# Patient Record
Sex: Male | Born: 1935 | Race: White | Hispanic: No | Marital: Married | Smoking: Former smoker
Health system: Southern US, Community
[De-identification: ages and names within clinical notes are randomized; demographics above are authoritative.]

## PROBLEM LIST (undated history)

## (undated) DIAGNOSIS — C61 Malignant neoplasm of prostate: Secondary | ICD-10-CM

## (undated) DIAGNOSIS — Z86718 Personal history of other venous thrombosis and embolism: Secondary | ICD-10-CM

## (undated) DIAGNOSIS — Z923 Personal history of irradiation: Secondary | ICD-10-CM

## (undated) DIAGNOSIS — M1990 Unspecified osteoarthritis, unspecified site: Secondary | ICD-10-CM

## (undated) DIAGNOSIS — L309 Dermatitis, unspecified: Secondary | ICD-10-CM

## (undated) DIAGNOSIS — N138 Other obstructive and reflux uropathy: Secondary | ICD-10-CM

## (undated) DIAGNOSIS — N329 Bladder disorder, unspecified: Secondary | ICD-10-CM

## (undated) DIAGNOSIS — N401 Enlarged prostate with lower urinary tract symptoms: Secondary | ICD-10-CM

## (undated) DIAGNOSIS — G4733 Obstructive sleep apnea (adult) (pediatric): Secondary | ICD-10-CM

## (undated) DIAGNOSIS — Z973 Presence of spectacles and contact lenses: Secondary | ICD-10-CM

## (undated) DIAGNOSIS — R413 Other amnesia: Secondary | ICD-10-CM

## (undated) DIAGNOSIS — I6930 Unspecified sequelae of cerebral infarction: Secondary | ICD-10-CM

## (undated) DIAGNOSIS — I480 Paroxysmal atrial fibrillation: Secondary | ICD-10-CM

---

## 2001-10-27 ENCOUNTER — Ambulatory Visit: Admission: RE | Admit: 2001-10-27 | Discharge: 2001-11-08 | Payer: Self-pay | Admitting: Radiation Oncology

## 2003-08-09 ENCOUNTER — Encounter: Admission: RE | Admit: 2003-08-09 | Discharge: 2003-08-09 | Payer: Self-pay | Admitting: Nephrology

## 2003-08-16 ENCOUNTER — Encounter: Admission: RE | Admit: 2003-08-16 | Discharge: 2003-08-16 | Payer: Self-pay | Admitting: Nephrology

## 2003-09-08 ENCOUNTER — Ambulatory Visit: Admission: RE | Admit: 2003-09-08 | Discharge: 2003-10-18 | Payer: Self-pay | Admitting: Radiation Oncology

## 2003-10-24 ENCOUNTER — Ambulatory Visit: Admission: RE | Admit: 2003-10-24 | Discharge: 2004-01-22 | Payer: Self-pay | Admitting: Radiation Oncology

## 2004-05-14 ENCOUNTER — Ambulatory Visit (HOSPITAL_COMMUNITY): Admission: RE | Admit: 2004-05-14 | Discharge: 2004-05-14 | Payer: Self-pay | Admitting: Nephrology

## 2007-11-23 ENCOUNTER — Ambulatory Visit (HOSPITAL_COMMUNITY): Admission: RE | Admit: 2007-11-23 | Discharge: 2007-11-23 | Payer: Self-pay | Admitting: Urology

## 2009-06-29 ENCOUNTER — Encounter: Admission: RE | Admit: 2009-06-29 | Discharge: 2009-08-27 | Payer: Self-pay

## 2010-03-13 ENCOUNTER — Ambulatory Visit
Admission: RE | Admit: 2010-03-13 | Discharge: 2010-03-13 | Disposition: A | Discharge status: Home / Self Care | Payer: BC Managed Care – PPO | Source: Ambulatory Visit | Attending: Internal Medicine | Admitting: Internal Medicine

## 2010-03-13 ENCOUNTER — Other Ambulatory Visit: Payer: Self-pay | Admitting: Internal Medicine

## 2010-03-13 DIAGNOSIS — R52 Pain, unspecified: Secondary | ICD-10-CM

## 2011-02-04 HISTORY — PX: LUMBAR DISC SURGERY: SHX700

## 2011-07-15 ENCOUNTER — Ambulatory Visit
Admission: RE | Admit: 2011-07-15 | Discharge: 2011-07-15 | Disposition: A | Discharge status: Home / Self Care | Payer: BC Managed Care – PPO | Source: Ambulatory Visit | Attending: Nurse Practitioner | Admitting: Nurse Practitioner

## 2011-07-15 ENCOUNTER — Other Ambulatory Visit: Payer: Self-pay | Admitting: Nurse Practitioner

## 2011-07-15 DIAGNOSIS — M549 Dorsalgia, unspecified: Secondary | ICD-10-CM

## 2011-07-22 ENCOUNTER — Other Ambulatory Visit: Payer: Self-pay | Admitting: Nurse Practitioner

## 2011-07-22 DIAGNOSIS — M549 Dorsalgia, unspecified: Secondary | ICD-10-CM

## 2011-07-24 ENCOUNTER — Ambulatory Visit
Admission: RE | Admit: 2011-07-24 | Discharge: 2011-07-24 | Disposition: A | Discharge status: Home / Self Care | Payer: Medicare Other | Source: Ambulatory Visit | Attending: Nurse Practitioner | Admitting: Nurse Practitioner

## 2011-07-24 DIAGNOSIS — M549 Dorsalgia, unspecified: Secondary | ICD-10-CM

## 2012-01-06 ENCOUNTER — Ambulatory Visit: Payer: Medicare Other | Attending: Surgery | Admitting: Physical Therapy

## 2012-01-06 DIAGNOSIS — R5381 Other malaise: Secondary | ICD-10-CM | POA: Insufficient documentation

## 2012-01-06 DIAGNOSIS — M545 Low back pain, unspecified: Secondary | ICD-10-CM | POA: Insufficient documentation

## 2012-01-06 DIAGNOSIS — IMO0001 Reserved for inherently not codable concepts without codable children: Secondary | ICD-10-CM | POA: Insufficient documentation

## 2012-01-06 DIAGNOSIS — R262 Difficulty in walking, not elsewhere classified: Secondary | ICD-10-CM | POA: Insufficient documentation

## 2012-01-06 DIAGNOSIS — R293 Abnormal posture: Secondary | ICD-10-CM | POA: Insufficient documentation

## 2012-01-07 ENCOUNTER — Ambulatory Visit: Payer: Medicare Other | Admitting: Rehabilitation

## 2012-01-14 ENCOUNTER — Ambulatory Visit: Payer: Medicare Other | Admitting: Physical Therapy

## 2012-01-16 ENCOUNTER — Ambulatory Visit: Payer: Medicare Other | Admitting: Rehabilitation

## 2012-01-19 ENCOUNTER — Ambulatory Visit: Payer: Medicare Other | Admitting: Physical Therapy

## 2012-01-22 ENCOUNTER — Ambulatory Visit: Payer: Medicare Other | Admitting: Physical Therapy

## 2012-01-26 ENCOUNTER — Ambulatory Visit: Payer: Medicare Other | Admitting: Rehabilitation

## 2012-01-29 ENCOUNTER — Ambulatory Visit: Payer: Medicare Other | Admitting: Physical Therapy

## 2012-02-09 ENCOUNTER — Ambulatory Visit: Payer: Medicare Other | Attending: Surgery | Admitting: Rehabilitation

## 2012-02-09 DIAGNOSIS — M545 Low back pain, unspecified: Secondary | ICD-10-CM | POA: Insufficient documentation

## 2012-02-09 DIAGNOSIS — R5381 Other malaise: Secondary | ICD-10-CM | POA: Insufficient documentation

## 2012-02-09 DIAGNOSIS — R262 Difficulty in walking, not elsewhere classified: Secondary | ICD-10-CM | POA: Insufficient documentation

## 2012-02-09 DIAGNOSIS — IMO0001 Reserved for inherently not codable concepts without codable children: Secondary | ICD-10-CM | POA: Insufficient documentation

## 2012-02-09 DIAGNOSIS — R293 Abnormal posture: Secondary | ICD-10-CM | POA: Insufficient documentation

## 2012-02-12 ENCOUNTER — Ambulatory Visit: Payer: Medicare Other | Admitting: Rehabilitation

## 2012-02-16 ENCOUNTER — Ambulatory Visit: Payer: Medicare Other | Admitting: Rehabilitation

## 2012-02-19 ENCOUNTER — Ambulatory Visit: Payer: Medicare Other | Admitting: Rehabilitation

## 2012-02-23 ENCOUNTER — Ambulatory Visit: Payer: Medicare Other | Admitting: Rehabilitation

## 2012-02-26 ENCOUNTER — Ambulatory Visit: Payer: Medicare Other | Admitting: Physical Therapy

## 2012-03-01 ENCOUNTER — Ambulatory Visit: Payer: Medicare Other | Admitting: Physical Therapy

## 2012-03-04 ENCOUNTER — Ambulatory Visit: Payer: Medicare Other | Admitting: Physical Therapy

## 2012-03-08 ENCOUNTER — Ambulatory Visit: Payer: Medicare Other | Attending: Surgery | Admitting: Rehabilitation

## 2012-03-08 DIAGNOSIS — M545 Low back pain, unspecified: Secondary | ICD-10-CM | POA: Insufficient documentation

## 2012-03-08 DIAGNOSIS — R262 Difficulty in walking, not elsewhere classified: Secondary | ICD-10-CM | POA: Insufficient documentation

## 2012-03-08 DIAGNOSIS — IMO0001 Reserved for inherently not codable concepts without codable children: Secondary | ICD-10-CM | POA: Insufficient documentation

## 2012-03-08 DIAGNOSIS — R5381 Other malaise: Secondary | ICD-10-CM | POA: Insufficient documentation

## 2012-03-08 DIAGNOSIS — R293 Abnormal posture: Secondary | ICD-10-CM | POA: Insufficient documentation

## 2012-03-10 ENCOUNTER — Ambulatory Visit: Payer: Medicare Other | Admitting: Rehabilitation

## 2012-03-15 ENCOUNTER — Ambulatory Visit: Payer: Medicare Other | Admitting: Physical Therapy

## 2012-03-18 ENCOUNTER — Encounter: Payer: Medicare Other | Admitting: Rehabilitation

## 2012-03-29 ENCOUNTER — Ambulatory Visit: Payer: Medicare Other | Admitting: Rehabilitation

## 2012-04-08 ENCOUNTER — Other Ambulatory Visit: Payer: Self-pay | Admitting: Urology

## 2012-04-08 DIAGNOSIS — C61 Malignant neoplasm of prostate: Secondary | ICD-10-CM

## 2012-05-24 ENCOUNTER — Encounter (HOSPITAL_COMMUNITY)
Admission: RE | Admit: 2012-05-24 | Discharge: 2012-05-24 | Disposition: A | Discharge status: Home / Self Care | Payer: Medicare Other | Source: Ambulatory Visit | Attending: Urology | Admitting: Urology

## 2012-05-24 DIAGNOSIS — C61 Malignant neoplasm of prostate: Secondary | ICD-10-CM

## 2012-05-24 MED ORDER — TECHNETIUM TC 99M MEDRONATE IV KIT
25.0000 | PACK | Freq: Once | INTRAVENOUS | Status: AC | PRN
  Administered 2012-05-24: 27 via INTRAVENOUS

## 2012-06-04 HISTORY — PX: TRANSTHORACIC ECHOCARDIOGRAM: SHX275

## 2012-06-11 ENCOUNTER — Other Ambulatory Visit (HOSPITAL_COMMUNITY): Payer: Self-pay | Admitting: Internal Medicine

## 2012-06-11 DIAGNOSIS — R609 Edema, unspecified: Secondary | ICD-10-CM

## 2012-06-11 DIAGNOSIS — I4891 Unspecified atrial fibrillation: Secondary | ICD-10-CM

## 2012-06-22 ENCOUNTER — Ambulatory Visit (HOSPITAL_COMMUNITY)
Admission: RE | Admit: 2012-06-22 | Discharge: 2012-06-22 | Disposition: A | Discharge status: Home / Self Care | Payer: Medicare Other | Source: Ambulatory Visit | Attending: Internal Medicine | Admitting: Internal Medicine

## 2012-06-22 DIAGNOSIS — I4891 Unspecified atrial fibrillation: Secondary | ICD-10-CM

## 2012-06-22 DIAGNOSIS — R609 Edema, unspecified: Secondary | ICD-10-CM | POA: Insufficient documentation

## 2012-06-22 NOTE — Progress Notes (Signed)
[facility]   2D echo completed 06/22/2012.   Cindy Anguel Delapena, RDCS  

## 2012-12-22 ENCOUNTER — Other Ambulatory Visit: Payer: Self-pay | Admitting: Urology

## 2012-12-22 DIAGNOSIS — C61 Malignant neoplasm of prostate: Secondary | ICD-10-CM

## 2013-01-06 ENCOUNTER — Encounter (HOSPITAL_COMMUNITY)
Admission: RE | Admit: 2013-01-06 | Discharge: 2013-01-06 | Disposition: A | Discharge status: Home / Self Care | Payer: Medicare Other | Source: Ambulatory Visit | Attending: Urology | Admitting: Urology

## 2013-01-06 DIAGNOSIS — C61 Malignant neoplasm of prostate: Secondary | ICD-10-CM | POA: Insufficient documentation

## 2013-01-06 MED ORDER — FLUDEOXYGLUCOSE F - 18 (FDG) INJECTION
10.6000 | Freq: Once | INTRAVENOUS | Status: AC | PRN
  Administered 2013-01-06: 10.6 via INTRAVENOUS

## 2014-01-18 ENCOUNTER — Other Ambulatory Visit: Payer: Self-pay | Admitting: Urology

## 2014-01-18 DIAGNOSIS — C61 Malignant neoplasm of prostate: Secondary | ICD-10-CM

## 2014-02-08 ENCOUNTER — Encounter (HOSPITAL_COMMUNITY): Payer: Medicare Other

## 2014-05-22 ENCOUNTER — Encounter (HOSPITAL_COMMUNITY): Payer: Medicare Other

## 2014-05-22 ENCOUNTER — Other Ambulatory Visit: Payer: Self-pay | Admitting: Family Medicine

## 2014-05-22 DIAGNOSIS — R609 Edema, unspecified: Secondary | ICD-10-CM

## 2014-05-23 ENCOUNTER — Ambulatory Visit
Admission: RE | Admit: 2014-05-23 | Discharge: 2014-05-23 | Disposition: A | Discharge status: Home / Self Care | Payer: Medicare Other | Source: Ambulatory Visit | Attending: Family Medicine | Admitting: Family Medicine

## 2014-05-23 DIAGNOSIS — R609 Edema, unspecified: Secondary | ICD-10-CM

## 2014-05-29 ENCOUNTER — Encounter (HOSPITAL_COMMUNITY)
Admission: RE | Admit: 2014-05-29 | Discharge: 2014-05-29 | Disposition: A | Discharge status: Home / Self Care | Payer: Medicare Other | Source: Ambulatory Visit | Attending: Urology | Admitting: Urology

## 2014-05-29 DIAGNOSIS — C61 Malignant neoplasm of prostate: Secondary | ICD-10-CM | POA: Diagnosis not present

## 2014-05-29 MED ORDER — TECHNETIUM TC 99M MEDRONATE IV KIT
24.7000 | PACK | Freq: Once | INTRAVENOUS | Status: AC | PRN
  Administered 2014-05-29: 24.7 via INTRAVENOUS

## 2014-11-07 ENCOUNTER — Other Ambulatory Visit: Payer: Self-pay | Admitting: Gastroenterology

## 2014-11-07 DIAGNOSIS — R152 Fecal urgency: Secondary | ICD-10-CM

## 2014-11-13 ENCOUNTER — Other Ambulatory Visit: Payer: Self-pay | Admitting: Gastroenterology

## 2014-11-13 ENCOUNTER — Ambulatory Visit
Admission: RE | Admit: 2014-11-13 | Discharge: 2014-11-13 | Disposition: A | Discharge status: Home / Self Care | Payer: Medicare Other | Source: Ambulatory Visit | Attending: Gastroenterology | Admitting: Gastroenterology

## 2014-11-13 DIAGNOSIS — R152 Fecal urgency: Secondary | ICD-10-CM

## 2015-02-15 ENCOUNTER — Encounter (HOSPITAL_BASED_OUTPATIENT_CLINIC_OR_DEPARTMENT_OTHER): Payer: Self-pay | Admitting: *Deleted

## 2015-02-15 ENCOUNTER — Other Ambulatory Visit: Payer: Self-pay | Admitting: Urology

## 2015-02-16 ENCOUNTER — Encounter (HOSPITAL_BASED_OUTPATIENT_CLINIC_OR_DEPARTMENT_OTHER): Payer: Self-pay | Admitting: *Deleted

## 2015-02-16 NOTE — Progress Notes (Signed)
SPOKE W/ PT AND PT'S WIFE,  PT HAS IMPAIRED MEMORY, WIFE WILL NEED TO BE IN PRE-OP.  NPO AFTER MN.  ARRIVE AT 0745.  GETTING LAB WORK DONE WED. 02-21-2015 (CBC, BMET, PT/INR).  CURRENT EKG AND LOV NOTE FROM DR Lawerance Cruel, PT'S PCP.  WILL TAKE AM MEDS W/ SIPS OF WATER DOS.

## 2015-02-21 DIAGNOSIS — Z8673 Personal history of transient ischemic attack (TIA), and cerebral infarction without residual deficits: Secondary | ICD-10-CM | POA: Diagnosis not present

## 2015-02-21 DIAGNOSIS — Z6837 Body mass index (BMI) 37.0-37.9, adult: Secondary | ICD-10-CM | POA: Diagnosis not present

## 2015-02-21 DIAGNOSIS — Z8546 Personal history of malignant neoplasm of prostate: Secondary | ICD-10-CM | POA: Diagnosis not present

## 2015-02-21 DIAGNOSIS — G4733 Obstructive sleep apnea (adult) (pediatric): Secondary | ICD-10-CM | POA: Diagnosis not present

## 2015-02-21 DIAGNOSIS — M1990 Unspecified osteoarthritis, unspecified site: Secondary | ICD-10-CM | POA: Diagnosis not present

## 2015-02-21 DIAGNOSIS — N401 Enlarged prostate with lower urinary tract symptoms: Secondary | ICD-10-CM | POA: Diagnosis not present

## 2015-02-21 DIAGNOSIS — N3040 Irradiation cystitis without hematuria: Secondary | ICD-10-CM | POA: Diagnosis not present

## 2015-02-21 DIAGNOSIS — N3941 Urge incontinence: Secondary | ICD-10-CM | POA: Diagnosis not present

## 2015-02-21 DIAGNOSIS — R3914 Feeling of incomplete bladder emptying: Secondary | ICD-10-CM | POA: Diagnosis not present

## 2015-02-21 DIAGNOSIS — C672 Malignant neoplasm of lateral wall of bladder: Secondary | ICD-10-CM | POA: Diagnosis not present

## 2015-02-21 DIAGNOSIS — Z7901 Long term (current) use of anticoagulants: Secondary | ICD-10-CM | POA: Diagnosis not present

## 2015-02-21 DIAGNOSIS — N138 Other obstructive and reflux uropathy: Secondary | ICD-10-CM | POA: Diagnosis not present

## 2015-02-21 DIAGNOSIS — Z923 Personal history of irradiation: Secondary | ICD-10-CM | POA: Diagnosis not present

## 2015-02-21 DIAGNOSIS — R319 Hematuria, unspecified: Secondary | ICD-10-CM | POA: Diagnosis present

## 2015-02-21 DIAGNOSIS — E7800 Pure hypercholesterolemia, unspecified: Secondary | ICD-10-CM | POA: Diagnosis not present

## 2015-02-21 DIAGNOSIS — I4891 Unspecified atrial fibrillation: Secondary | ICD-10-CM | POA: Diagnosis not present

## 2015-02-21 DIAGNOSIS — Z79899 Other long term (current) drug therapy: Secondary | ICD-10-CM | POA: Diagnosis not present

## 2015-02-21 DIAGNOSIS — Z87891 Personal history of nicotine dependence: Secondary | ICD-10-CM | POA: Diagnosis not present

## 2015-02-21 DIAGNOSIS — I10 Essential (primary) hypertension: Secondary | ICD-10-CM | POA: Diagnosis not present

## 2015-02-21 LAB — BASIC METABOLIC PANEL
ANION GAP: 10 (ref 5–15)
BUN: 19 mg/dL (ref 6–20)
CO2: 27 mmol/L (ref 22–32)
Calcium: 9.5 mg/dL (ref 8.9–10.3)
Chloride: 107 mmol/L (ref 101–111)
Creatinine, Ser: 1.03 mg/dL (ref 0.61–1.24)
GFR calc Af Amer: >60 mL/min (ref >60)
GLUCOSE: 109 mg/dL — AB (ref 65–99)
POTASSIUM: 3.8 mmol/L (ref 3.5–5.1)
Sodium: 144 mmol/L (ref 135–145)

## 2015-02-21 LAB — CBC
HEMATOCRIT: 44.1 % (ref 39.0–52.0)
HEMOGLOBIN: 14.3 g/dL (ref 13.0–17.0)
MCH: 28.1 pg (ref 26.0–34.0)
MCHC: 32.4 g/dL (ref 30.0–36.0)
MCV: 86.8 fL (ref 78.0–100.0)
Platelets: 229 10*3/uL (ref 150–400)
RBC: 5.08 MIL/uL (ref 4.22–5.81)
RDW: 15.8 % — ABNORMAL HIGH (ref 11.5–15.5)
WBC: 10 10*3/uL (ref 4.0–10.5)

## 2015-02-21 LAB — PROTIME-INR
INR: 1.15 (ref 0.00–1.49)
Prothrombin Time: 14.9 seconds (ref 11.6–15.2)

## 2015-02-21 NOTE — H&P (Signed)
Active Problems Problems  1. Edema (R60.9) 2. Erectile dysfunction due to arterial insufficiency (N52.01) 3. Gross hematuria (R31.0) 4. Hypertension (I10) 5. 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 Joint pain, hip (M25.559) 8. Microscopic hematuria (R31.29) 9. Nodular prostate with lower urinary tract symptoms (N40.3) 10. Prostate cancer (C61) 11. Urge incontinence of urine (N39.41)  History of Present Illness [name] returns today in f/u from his CT for cystoscopy to complete his hematuria w/u.  He had a possible non-obstructing 62m LUVJ stone on CT but no other significant findings. He has had no further gross hematuria but has persistent microhematuria today.   Past Medical History Problems  1. History of Carbuncle, groin (L02.234) 2. Gross hematuria (R31.0) 3. History of bronchitis (Z87.09) 4. History of hypercholesterolemia (Z86.39) 5. History of tinea cruris (Z86.19) 6. History of Incomplete bladder emptying (R33.9) 7. History of Obstructive Sleep Apnea 8. Personal history of prostate cancer (Z85.46) 9. History of Stroke syndrome (I63.9) 10. History of Venous stasis of lower extremity (I87.8)  Surgical History Problems  1. History of Back Surgery 2. History of Inguinal Hernia Repair 3. History of Radiation Therapy 4. History of Tonsillectomy  Current Meds 1. AmLODIPine Besylate 5 MG Oral Tablet;  Therapy: [id number] Recorded 2. Atorvastatin Calcium 20 MG Oral Tablet;  Therapy: 15Apr2013 to Recorded 3. Warfarin Sodium 2 MG Oral Tablet; 6229mon Sunday, Tuesday, Thursday and 29m32mn  Monday, Wednesday, Friday and Saturday;  Therapy: [phone] Recorded  Allergies Medication  1. Novocain SOLN 2. Penicillins 3. Sulfa Drugs  Family History Problems  1. Family history of Family Health Status Number Of Children   2 daughters 2. No pertinent family history : Sibling  Social History Problems  1. Denied:  Alcohol Use (History) 2. Family history of Death In The Family Father   80 04-Apr-1980amily history of Death In The Family Mother   85 76 4ormer smoker ([phone]. Marital History - Currently Married 6. Occupation:   accOptometrist Tobacco Use  Review of Systems Genitourinary, constitutional, skin, eye, otolaryngeal, hematologic/lymphatic, cardiovascular, pulmonary, endocrine, musculoskeletal, gastrointestinal, neurological and psychiatric system(s) were reviewed and pertinent findings if present are noted and are otherwise negative.    Vitals Vital Signs [Data Includes: Last 1 Day]  Recorded: 04J99BZJ6967:55AM  Blood Pressure: 158 / 83 Temperature: 98.1 F Heart Rate: 62  Physical Exam Constitutional: Well nourished and well developed . No acute distress.  Pulmonary: No respiratory distress and normal respiratory rhythm and effort.  The rhythm was irregularly irregular. He has bilateral ankle edema.    Results/Data Urine [Data Includes: Last 1 Day]   [id number] AMBER   APPEARANCE CLEAR   SPECIFIC GRAVITY 1.025   pH 5.5   GLUCOSE NEGATIVE   BILIRUBIN NEGATIVE   KETONE NEGATIVE   BLOOD 1+   PROTEIN NEGATIVE   NITRITE NEGATIVE   LEUKOCYTE ESTERASE NEGATIVE   SQUAMOUS EPITHELIAL/HPF 0-5 HPF  WBC 0-5 WBC/HPF  RBC 3-10 RBC/HPF  BACTERIA NONE SEEN HPF  CRYSTALS NONE SEEN HPF  CASTS NONE SEEN LPF  Other SN   Yeast NONE SEEN HPF   The following images/tracing/specimen were independently visualized:  CT films and report reviewed.  The following clinical lab reports were reviewed:  UA reviewed. Selected Results  BUN & CREATININE 29D10CHE5277:20PM WreIrine SealPECIMEN TYPE: BLOOD   Test Name Result Flag Reference  CREATININE 0.80 mg/dL  0.50-1.50  BUN 17 mg/dL  6-30  Est GFR, African American >  89 mL/min  >=60  Est GFR, NonAfrican American 86 mL/min  >=60  PERFORMED AT:        [facility]                      [address]                       [place], [place] [address]   THE ESTIMATED GFR IS A CALCULATION VALID FOR ADULTS (>=71 YEARS OLD) THAT USES THE CKD-EPI ALGORITHM TO ADJUST FOR AGE AND SEX. IT IS   NOT TO BE USED FOR CHILDREN, PREGNANT WOMEN, HOSPITALIZED PATIENTS,    PATIENTS ON DIALYSIS, OR WITH RAPIDLY CHANGING KIDNEY FUNCTION. ACCORDING TO THE NKDEP, EGFR >89 IS NORMAL, 60-89 SHOWS MILD IMPAIRMENT, 30-59 SHOWS MODERATE IMPAIRMENT, 15-29 SHOWS SEVERE IMPAIRMENT AND <15 IS ESRD.   AU CT-HEMATURIA PROTOCOL 29Dec2016 12:00AM Bjorn Pippin   Test Name Result Flag Reference  AU CT-HEMATURIA PROTOCOL (Report)    ** [facility] **   CLINICAL DATA: Patient with history of gross hematuria for 2 weeks. History prostate cancer.  EXAM: CT ABDOMEN AND PELVIS WITHOUT AND WITH CONTRAST  TECHNIQUE: Multidetector CT imaging of the abdomen and pelvis was performed following the standard protocol before and following the bolus administration of intravenous contrast.  CONTRAST: 125 cc Isovue-300  COMPARISON: CT abdomen pelvis 07/18/2013.  FINDINGS: Lower chest: Normal heart size. Extensive coronary arterial vascular calcifications. Lung bases are clear. No pleural effusion.  Hepatobiliary: Liver is nodular in contour. Sub cm low-attenuation lesion adjacent to the gallbladder fossa (image 40; series 4), stable and too small to characterize. Gallbladder is unremarkable. No intrahepatic or extrahepatic biliary ductal dilatation.  Pancreas: Fatty atrophy of the pancreatic parenchyma.  Spleen: Unremarkable  Adrenals/Urinary Tract: Unchanged 1.7 cm left adrenal adenoma. Right adrenal gland is normal. The kidneys enhance symmetrically with contrast. No hydronephrosis. There is a 3 mm nonobstructing stone within the inferior pole of the right kidney. There is a 2 mm calcific density at the left UVJ (image 74; series 2). This is most compatible with a small stone. Delayed phase images  demonstrate excretion of contrast material into the bilateral renal collecting systems, ureters and bladder. No abnormal filling defects are identified. The dome of the urinary bladder is herniated within a right inguinal hernia.  Stomach/Bowel: No abnormal bowel wall thickening or evidence for bowel obstruction. No free fluid or free intraperitoneal air. Small hiatal hernia.  Vascular/Lymphatic: Normal caliber abdominal aorta. Peripheral calcified atherosclerotic plaque. Retro aortic left renal vein. No retroperitoneal lymphadenopathy.  Other: Fat containing bilateral inguinal hernias.  Musculoskeletal: No aggressive or acute appearing osseous lesions. Thoracic spine degenerative changes. Lower lumbar spine degenerative changes and fusion hardware.  IMPRESSION: There is a 2 mm stone which appears partially within the bladder at the level of the left UVJ. There is no left-sided hydronephrosis.  Tiny nonobstructing stone inferior pole right kidney.  No suspicious enhancing renal masses identified within either kidney. Delayed phase images demonstrate no suspicious filling defects within either kidney or ureter.  Mildly nodular contour of the liver raising the possibility of cirrhosis.   Electronically Signed  By: Annia Belt M.D.  On: 02/01/2015 15:47   URINE CULTURE 12Dec2016 11:44AM Bjorn Pippin  SOURCE : CLEAN CATCH SPECIMEN TYPE: URINE   Test Name Result Flag Reference  CULTURE, URINE Culture, Urine    ===== COLONY COUNT: =====  30,000 COLONIES/ML   FINAL REPORT:  MULTIPLE BACTERIAL MORPHOTYPES PRESENT, NONE  PREDOMINANT. SUGGEST APPROPRIATE RECOLLECTION IF   CLINICALLY INDICATED.   Procedure  Procedure: Cystoscopy   Indication: Hematuria.  Informed Consent: Risks, benefits, and potential adverse events were discussed and informed consent was obtained from the patient.  Prep: The patient was prepped with betadine.  Antibiotic prophylaxis:. Pt declined  antibiotics.  Procedure Note:  Urethral meatus:. No abnormalities.  Anterior urethra: No abnormalities.  Prostatic urethra:. Estimated length was 4 cm. There was visual obstruction of the prostatic urethra. The lateral prostatic lobes were enlarged. There is a small left mid nodule and radiation changes.  Bladder: Visulization was clear. The ureteral orifices were in the normal anatomic position bilaterally and had clear efflux of urine. A systematic survey of the bladder demonstrated no bladder tumors or stones. Examination of the bladder demonstrated moderate trabeculation erythematous mucosa (He has an area about 1.5cm in size lateral to the left UO with erythema and a possible small area of papillary fronds. There are 2 areas with calcium deposition as well. ). The patient tolerated the procedure well.  Complications: None.    Assessment Assessed  1. 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 Gross hematuria (R31.0) 3. 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  He has BPH with BOO and radiation changes in the prostate.   Plan Health Maintenance  1. UA With REFLEX; [Do Not Release]; Status:Resulted - Requires Verification;   Done:  [id number] 09:13AM Irradiation cystitis  2. Follow-up Schedule Surgery Office  Follow-up  Status: Complete  Done: [id number]  He needs a biopsy of the bladder with fulguration but will need to be off of warfarin.  I reviewed the risks of bleeding, infection, bladder injury, need for a catheter, thrombotic events and anesthetic complications.   Discussion/Summary CC: Dr. Myriam Jacobson.

## 2015-02-22 ENCOUNTER — Encounter (HOSPITAL_BASED_OUTPATIENT_CLINIC_OR_DEPARTMENT_OTHER): Payer: Self-pay | Admitting: *Deleted

## 2015-02-22 ENCOUNTER — Ambulatory Visit (HOSPITAL_BASED_OUTPATIENT_CLINIC_OR_DEPARTMENT_OTHER): Payer: Medicare Other | Admitting: Anesthesiology

## 2015-02-22 ENCOUNTER — Encounter (HOSPITAL_BASED_OUTPATIENT_CLINIC_OR_DEPARTMENT_OTHER)
Admission: RE | Disposition: A | Discharge status: Home / Self Care | Payer: Self-pay | Source: Ambulatory Visit | Attending: Urology

## 2015-02-22 ENCOUNTER — Ambulatory Visit (HOSPITAL_BASED_OUTPATIENT_CLINIC_OR_DEPARTMENT_OTHER)
Admission: RE | Admit: 2015-02-22 | Discharge: 2015-02-22 | Disposition: A | Discharge status: Home / Self Care | Payer: Medicare Other | Source: Ambulatory Visit | Attending: Urology | Admitting: Urology

## 2015-02-22 DIAGNOSIS — I10 Essential (primary) hypertension: Secondary | ICD-10-CM | POA: Diagnosis not present

## 2015-02-22 DIAGNOSIS — N3941 Urge incontinence: Secondary | ICD-10-CM | POA: Insufficient documentation

## 2015-02-22 DIAGNOSIS — R3914 Feeling of incomplete bladder emptying: Secondary | ICD-10-CM | POA: Insufficient documentation

## 2015-02-22 DIAGNOSIS — E7800 Pure hypercholesterolemia, unspecified: Secondary | ICD-10-CM | POA: Diagnosis not present

## 2015-02-22 DIAGNOSIS — Z8673 Personal history of transient ischemic attack (TIA), and cerebral infarction without residual deficits: Secondary | ICD-10-CM | POA: Insufficient documentation

## 2015-02-22 DIAGNOSIS — M1990 Unspecified osteoarthritis, unspecified site: Secondary | ICD-10-CM | POA: Insufficient documentation

## 2015-02-22 DIAGNOSIS — N3040 Irradiation cystitis without hematuria: Secondary | ICD-10-CM | POA: Insufficient documentation

## 2015-02-22 DIAGNOSIS — C672 Malignant neoplasm of lateral wall of bladder: Secondary | ICD-10-CM | POA: Diagnosis not present

## 2015-02-22 DIAGNOSIS — Z7901 Long term (current) use of anticoagulants: Secondary | ICD-10-CM | POA: Insufficient documentation

## 2015-02-22 DIAGNOSIS — N138 Other obstructive and reflux uropathy: Secondary | ICD-10-CM | POA: Insufficient documentation

## 2015-02-22 DIAGNOSIS — N401 Enlarged prostate with lower urinary tract symptoms: Secondary | ICD-10-CM | POA: Insufficient documentation

## 2015-02-22 DIAGNOSIS — Z6837 Body mass index (BMI) 37.0-37.9, adult: Secondary | ICD-10-CM | POA: Insufficient documentation

## 2015-02-22 DIAGNOSIS — I4891 Unspecified atrial fibrillation: Secondary | ICD-10-CM | POA: Insufficient documentation

## 2015-02-22 DIAGNOSIS — G4733 Obstructive sleep apnea (adult) (pediatric): Secondary | ICD-10-CM | POA: Insufficient documentation

## 2015-02-22 DIAGNOSIS — Z8546 Personal history of malignant neoplasm of prostate: Secondary | ICD-10-CM | POA: Insufficient documentation

## 2015-02-22 DIAGNOSIS — Z87891 Personal history of nicotine dependence: Secondary | ICD-10-CM | POA: Insufficient documentation

## 2015-02-22 DIAGNOSIS — Z79899 Other long term (current) drug therapy: Secondary | ICD-10-CM | POA: Insufficient documentation

## 2015-02-22 DIAGNOSIS — Z923 Personal history of irradiation: Secondary | ICD-10-CM | POA: Insufficient documentation

## 2015-02-22 HISTORY — DX: Benign prostatic hyperplasia with lower urinary tract symptoms: N40.1

## 2015-02-22 HISTORY — DX: Unspecified sequelae of cerebral infarction: I69.30

## 2015-02-22 HISTORY — DX: Other amnesia: R41.3

## 2015-02-22 HISTORY — DX: Obstructive sleep apnea (adult) (pediatric): G47.33

## 2015-02-22 HISTORY — DX: Presence of spectacles and contact lenses: Z97.3

## 2015-02-22 HISTORY — DX: Malignant neoplasm of prostate: C61

## 2015-02-22 HISTORY — DX: Dermatitis, unspecified: L30.9

## 2015-02-22 HISTORY — DX: Bladder disorder, unspecified: N32.9

## 2015-02-22 HISTORY — PX: CYSTOSCOPY WITH BIOPSY: SHX5122

## 2015-02-22 HISTORY — DX: Personal history of other venous thrombosis and embolism: Z86.718

## 2015-02-22 HISTORY — DX: Other obstructive and reflux uropathy: N13.8

## 2015-02-22 HISTORY — DX: Unspecified osteoarthritis, unspecified site: M19.90

## 2015-02-22 HISTORY — DX: Paroxysmal atrial fibrillation: I48.0

## 2015-02-22 HISTORY — DX: Personal history of irradiation: Z92.3

## 2015-02-22 SURGERY — CYSTOSCOPY, WITH BIOPSY
Anesthesia: General | Site: Bladder

## 2015-02-22 MED ORDER — FENTANYL CITRATE (PF) 100 MCG/2ML IJ SOLN
INTRAMUSCULAR | Status: AC
  Filled 2015-02-22: qty 2

## 2015-02-22 MED ORDER — STERILE WATER FOR IRRIGATION IR SOLN
Status: DC | PRN
  Administered 2015-02-22: 3000 mL via INTRAVESICAL

## 2015-02-22 MED ORDER — SODIUM CHLORIDE 0.9 % IJ SOLN
3.0000 mL | INTRAMUSCULAR | Status: DC | PRN
  Filled 2015-02-22: qty 3

## 2015-02-22 MED ORDER — FENTANYL CITRATE (PF) 100 MCG/2ML IJ SOLN
25.0000 ug | INTRAMUSCULAR | Status: DC | PRN
  Filled 2015-02-22: qty 1

## 2015-02-22 MED ORDER — SODIUM CHLORIDE 0.9 % IV SOLN
250.0000 mL | INTRAVENOUS | Status: DC | PRN
  Filled 2015-02-22: qty 250

## 2015-02-22 MED ORDER — CIPROFLOXACIN IN D5W 400 MG/200ML IV SOLN
400.0000 mg | INTRAVENOUS | Status: AC
  Administered 2015-02-22: 400 mg via INTRAVENOUS
  Filled 2015-02-22: qty 200

## 2015-02-22 MED ORDER — FENTANYL CITRATE (PF) 100 MCG/2ML IJ SOLN
INTRAMUSCULAR | Status: DC | PRN
  Administered 2015-02-22: 50 ug via INTRAVENOUS

## 2015-02-22 MED ORDER — DEXAMETHASONE SODIUM PHOSPHATE 4 MG/ML IJ SOLN
INTRAMUSCULAR | Status: DC | PRN
  Administered 2015-02-22: 10 mg via INTRAVENOUS

## 2015-02-22 MED ORDER — CIPROFLOXACIN IN D5W 400 MG/200ML IV SOLN
INTRAVENOUS | Status: AC
  Filled 2015-02-22: qty 200

## 2015-02-22 MED ORDER — LIDOCAINE HCL (CARDIAC) 20 MG/ML IV SOLN
INTRAVENOUS | Status: DC | PRN
  Administered 2015-02-22: 50 mg via INTRAVENOUS

## 2015-02-22 MED ORDER — PROPOFOL 10 MG/ML IV BOLUS
INTRAVENOUS | Status: DC | PRN
  Administered 2015-02-22: 200 mg via INTRAVENOUS

## 2015-02-22 MED ORDER — SODIUM CHLORIDE 0.9 % IJ SOLN
3.0000 mL | Freq: Two times a day (BID) | INTRAMUSCULAR | Status: DC
  Filled 2015-02-22: qty 3

## 2015-02-22 MED ORDER — PROPOFOL 10 MG/ML IV BOLUS
INTRAVENOUS | Status: AC
  Filled 2015-02-22: qty 20

## 2015-02-22 MED ORDER — ONDANSETRON HCL 4 MG/2ML IJ SOLN
INTRAMUSCULAR | Status: DC | PRN
  Administered 2015-02-22: 4 mg via INTRAVENOUS

## 2015-02-22 MED ORDER — ACETAMINOPHEN 650 MG RE SUPP
650.0000 mg | RECTAL | Status: DC | PRN
  Filled 2015-02-22: qty 1

## 2015-02-22 MED ORDER — TRAMADOL-ACETAMINOPHEN 37.5-325 MG PO TABS: 1.0000 | ORAL_TABLET | Freq: Four times a day (QID) | ORAL | Status: DC | PRN

## 2015-02-22 MED ORDER — LIDOCAINE HCL (CARDIAC) 20 MG/ML IV SOLN
INTRAVENOUS | Status: AC
  Filled 2015-02-22: qty 5

## 2015-02-22 MED ORDER — ACETAMINOPHEN 325 MG PO TABS
650.0000 mg | ORAL_TABLET | ORAL | Status: DC | PRN
  Filled 2015-02-22: qty 2

## 2015-02-22 MED ORDER — OXYCODONE HCL 5 MG PO TABS
5.0000 mg | ORAL_TABLET | ORAL | Status: DC | PRN
  Filled 2015-02-22: qty 2

## 2015-02-22 MED ORDER — LACTATED RINGERS IV SOLN
INTRAVENOUS | Status: DC
  Administered 2015-02-22: 09:00:00 via INTRAVENOUS
  Filled 2015-02-22: qty 1000

## 2015-02-22 MED ORDER — ONDANSETRON HCL 4 MG/2ML IJ SOLN
INTRAMUSCULAR | Status: AC
  Filled 2015-02-22: qty 2

## 2015-02-22 MED ORDER — PROMETHAZINE HCL 25 MG/ML IJ SOLN
6.2500 mg | INTRAMUSCULAR | Status: DC | PRN
  Filled 2015-02-22: qty 1

## 2015-02-22 MED ORDER — DEXAMETHASONE SODIUM PHOSPHATE 10 MG/ML IJ SOLN
INTRAMUSCULAR | Status: AC
  Filled 2015-02-22: qty 1

## 2015-02-22 SURGICAL SUPPLY — 17 items
BAG DRAIN URO-CYSTO SKYTR STRL (DRAIN) ×2 IMPLANT
BAG DRN UROCATH (DRAIN) ×1
CLOTH BEACON ORANGE TIMEOUT ST (SAFETY) ×2 IMPLANT
ELECT REM PT RETURN 9FT ADLT (ELECTROSURGICAL) ×2
ELECTRODE REM PT RTRN 9FT ADLT (ELECTROSURGICAL) ×1 IMPLANT
GLOVE BIO SURGEON STRL SZ 6.5 (GLOVE) ×2 IMPLANT
GLOVE INDICATOR 6.5 STRL GRN (GLOVE) ×2 IMPLANT
GLOVE SURG SS PI 8.0 STRL IVOR (GLOVE) ×2 IMPLANT
GOWN STRL REUS W/ TWL LRG LVL3 (GOWN DISPOSABLE) ×1 IMPLANT
GOWN STRL REUS W/ TWL XL LVL3 (GOWN DISPOSABLE) ×1 IMPLANT
GOWN STRL REUS W/TWL LRG LVL3 (GOWN DISPOSABLE) ×4
GOWN STRL REUS W/TWL XL LVL3 (GOWN DISPOSABLE) ×2
KIT ROOM TURNOVER WOR (KITS) ×2 IMPLANT
MANIFOLD NEPTUNE II (INSTRUMENTS) IMPLANT
NEEDLE HYPO 22GX1.5 SAFETY (NEEDLE) ×1 IMPLANT
PACK CYSTO (CUSTOM PROCEDURE TRAY) ×2 IMPLANT
WATER STERILE IRR 3000ML UROMA (IV SOLUTION) ×2 IMPLANT

## 2015-02-22 NOTE — Transfer of Care (Addendum)
Immediate Anesthesia Transfer of Care Note  Patient: Taylor Cervantes  Procedure(s) Performed: Procedure(s): CYSTOSCOPY WITH BLADDER BIOPSY AND FULGURATION (N/A)  Patient Location: PACU  Anesthesia Type:General  Level of Consciousness: sedated and responds to stimulation  Airway & Oxygen Therapy: Patient Spontanous Breathing and Patient connected to nasal cannula oxygen  Post-op Assessment: Report given to RN  Post vital signs: Reviewed and stable  Last Vitals: 139/75, 61 Filed Vitals:   02/22/15 0807  BP: 187/99  Pulse: 67  Temp: 36.4 C  Resp: 18    Complications: No apparent anesthesia complications

## 2015-02-22 NOTE — Anesthesia Procedure Notes (Signed)
Procedure Name: LMA Insertion Date/Time: 02/22/2015 8:56 AM Performed by: Bethena Roys T Pre-anesthesia Checklist: Patient identified, Emergency Drugs available, Suction available and Patient being monitored Patient Re-evaluated:Patient Re-evaluated prior to inductionOxygen Delivery Method: Circle System Utilized Preoxygenation: Pre-oxygenation with 100% oxygen Intubation Type: IV induction Ventilation: Mask ventilation without difficulty LMA: LMA with gastric port inserted LMA Size: 5.0 Number of attempts: 1 Placement Confirmation: positive ETCO2 Tube secured with: Tape Dental Injury: Teeth and Oropharynx as per pre-operative assessment

## 2015-02-22 NOTE — Anesthesia Preprocedure Evaluation (Addendum)
Anesthesia Evaluation  Patient identified by MRN, date of birth, ID band Patient awake    Reviewed: Allergy & Precautions, NPO status , Patient's Chart, lab work & pertinent test results  Airway Mallampati: II  TM Distance: >3 FB Neck ROM: Full    Dental  (+) Dental Advisory Given   Pulmonary sleep apnea , former smoker,    breath sounds clear to auscultation       Cardiovascular + dysrhythmias Atrial Fibrillation  Rhythm:Irregular Rate:Normal  EF 60-65% on 2014 echo. Mild MR. Mild AI.   Neuro/Psych CVA, Residual Symptoms    GI/Hepatic negative GI ROS, Neg liver ROS,   Endo/Other  Morbid obesity  Renal/GU negative Renal ROS     Musculoskeletal  (+) Arthritis ,   Abdominal   Peds  Hematology negative hematology ROS (+)   Anesthesia Other Findings   Reproductive/Obstetrics                            Lab Results  Component Value Date   WBC 10.0 02/21/2015   HGB 14.3 02/21/2015   HCT 44.1 02/21/2015   MCV 86.8 02/21/2015   PLT 229 02/21/2015   Lab Results  Component Value Date   CREATININE 1.03 02/21/2015   BUN 19 02/21/2015   NA 144 02/21/2015   K 3.8 02/21/2015   CL 107 02/21/2015   CO2 27 02/21/2015   Lab Results  Component Value Date   INR 1.15 02/21/2015    Anesthesia Physical Anesthesia Plan  ASA: III  Anesthesia Plan: General   Post-op Pain Management:    Induction: Intravenous  Airway Management Planned: LMA  Additional Equipment:   Intra-op Plan:   Post-operative Plan: Extubation in OR  Informed Consent: I have reviewed the patients History and Physical, chart, labs and discussed the procedure including the risks, benefits and alternatives for the proposed anesthesia with the patient or authorized representative who has indicated his/her understanding and acceptance.   Dental advisory given  Plan Discussed with: CRNA  Anesthesia Plan Comments:          Anesthesia Quick Evaluation

## 2015-02-22 NOTE — Anesthesia Postprocedure Evaluation (Signed)
Anesthesia Post Note  Patient: Taylor Cervantes  Procedure(s) Performed: Procedure(s) (LRB): CYSTOSCOPY WITH BLADDER BIOPSY AND FULGURATION (N/A)  Patient location during evaluation: PACU Anesthesia Type: General Level of consciousness: awake and alert Pain management: pain level controlled Vital Signs Assessment: post-procedure vital signs reviewed and stable Respiratory status: spontaneous breathing Cardiovascular status: blood pressure returned to baseline Anesthetic complications: no    Last Vitals:  Filed Vitals:   02/22/15 1020 02/22/15 1105  BP: 179/89 181/79  Pulse: 68 76  Temp:    Resp: 15 16    Last Pain:  Filed Vitals:   02/22/15 1111  PainSc: Asleep                 Tiajuana Amass

## 2015-02-22 NOTE — Discharge Instructions (Addendum)
CYSTOSCOPY HOME CARE INSTRUCTIONS  Activity: Rest for the remainder of the day.  Do not drive or operate equipment today.  You may resume normal activities in one to two days as instructed by your physician.   Meals: Drink plenty of liquids and eat light foods such as gelatin or soup this evening.  You may return to a normal meal plan tomorrow.  Return to Work: You may return to work in one to two days or as instructed by your physician.  Special Instructions / Symptoms: Call your physician if any of these symptoms occur:   -persistent or heavy bleeding  -bleeding which continues after first few urination  -large blood clots that are difficult to pass  -urine stream diminishes or stops completely  -fever equal to or higher than 101 degrees Farenheit.  -cloudy urine with a strong, foul odor  -severe pain  Females should always wipe from front to back after elimination.  You may feel some burning pain when you urinate.  This should disappear with time.  Applying moist heat to the lower abdomen or a hot tub bath may help relieve the pain. \  You may resume the warfarin in 48 hours if there is no significant bleeding.   Patient Signature:  ________________________________________________________  Nurse's Signature:  ________________________________________________________  Post Anesthesia Home Care Instructions  Activity: Get plenty of rest for the remainder of the day. A responsible adult should stay with you for 24 hours following the procedure.  For the next 24 hours, DO NOT: -Drive a car -Paediatric nurse -Drink alcoholic beverages -Take any medication unless instructed by your physician -Make any legal decisions or sign important papers.  Meals: Start with liquid foods such as gelatin or soup. Progress to regular foods as tolerated. Avoid greasy, spicy, heavy foods. If nausea and/or vomiting occur, drink only clear liquids until the nausea and/or vomiting subsides. Call  your physician if vomiting continues.  Special Instructions/Symptoms: Your throat may feel dry or sore from the anesthesia or the breathing tube placed in your throat during surgery. If this causes discomfort, gargle with warm salt water. The discomfort should disappear within 24 hours.  If you had a scopolamine patch placed behind your ear for the management of post- operative nausea and/or vomiting:  1. The medication in the patch is effective for 72 hours, after which it should be removed.  Wrap patch in a tissue and discard in the trash. Wash hands thoroughly with soap and water. 2. You may remove the patch earlier than 72 hours if you experience unpleasant side effects which may include dry mouth, dizziness or visual disturbances. 3. Avoid touching the patch. Wash your hands with soap and water after contact with the patch.

## 2015-02-22 NOTE — Interval H&P Note (Signed)
History and Physical Interval Note:  02/22/2015 8:12 AM  Taylor Cervantes  has presented today for surgery, with the diagnosis of bladder lesion  The various methods of treatment have been discussed with the patient and family. After consideration of risks, benefits and other options for treatment, the patient has consented to  Procedure(s): CYSTOSCOPY WITH BLADDER BIOPSY AND FULGURATION (N/A) as a surgical intervention .  The patient's history has been reviewed, patient examined, no change in status, stable for surgery.  I have reviewed the patient's chart and labs.  Questions were answered to the patient's satisfaction.     Carlisha Wisler J

## 2015-02-22 NOTE — Brief Op Note (Signed)
02/22/2015  9:24 AM  PATIENT:  Denton Ar  81 y.o. male  PRE-OPERATIVE DIAGNOSIS:  bladder lesion  POST-OPERATIVE DIAGNOSIS:  Bladder neoplasm  PROCEDURE:  Procedure(s): CYSTOSCOPY WITH BLADDER BIOPSY AND FULGURATION (N/A) 2cm lesion  SURGEON:  Surgeon(s) and Role:    * Irine Seal, MD - Primary  PHYSICIAN ASSISTANT:   ASSISTANTS: none   ANESTHESIA:   general  EBL:  Total I/O In: 200 [I.V.:200] Out: -   BLOOD ADMINISTERED:none  DRAINS: none   LOCAL MEDICATIONS USED:  NONE  SPECIMEN:  Source of Specimen:  left lateral wall of bladder  DISPOSITION OF SPECIMEN:  PATHOLOGY  COUNTS:  YES  TOURNIQUET:  * No tourniquets in log *  DICTATION: .Other Dictation: Dictation Number [id number]  PLAN OF CARE: Discharge to home after PACU  PATIENT DISPOSITION:  PACU - hemodynamically stable.   Delay start of Pharmacological VTE agent (>24hrs) due to surgical blood loss or risk of bleeding: yes

## 2015-02-23 ENCOUNTER — Encounter (HOSPITAL_BASED_OUTPATIENT_CLINIC_OR_DEPARTMENT_OTHER): Payer: Self-pay | Admitting: Urology

## 2015-02-23 NOTE — Op Note (Signed)
NAMELAMINE, CARAVANTES NO.:  [id number]  MEDICAL RECORD NO.:  [id number]  LOCATION:                                 FACILITY:  PHYSICIAN:  Marshall Cork. Jeffie Pollock, M.D.         DATE OF BIRTH:  DATE OF PROCEDURE:  02/22/2015 DATE OF DISCHARGE:                              OPERATIVE REPORT   PROCEDURE:  Cystoscopy with biopsy and fulguration of lesion from the left lateral wall.  PREOPERATIVE DIAGNOSIS:  Bladder wall lesion.  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  SURGEON:  Marshall Cork. Jeffie Pollock, M.D.  ANESTHESIA:  General.  SPECIMEN:  Cup biopsies from left bladder wall lesion.  DRAINS:  None.  BLOOD LOSS:  Minimal.  COMPLICATIONS:  None.  INDICATIONS:  Taylor Cervantes is a 81 year old white male with a history of prior radiation therapy for prostate cancer.  He was found on recent evaluation to have hematuria.  CT scan revealed no upper tract abnormalities.  Cystoscopy in the office demonstrated small papillary lesions on the left lateral wall that were felt to merit biopsy.  FINDINGS OF PROCEDURE:  He was taken to the operating room where he was given antibiotics and general anesthetic was induced.  He was placed in lithotomy position and fitted with PAS hose.  His perineum and genitalia were prepped with Betadine solution.  He was draped in the usual sterile fashion.  Cystoscopy was performed using the 23-French scope and 30- and 70-degree lenses.  Examination revealed a normal urethra.  The external sphincter was intact.  Prostatic urethra was approximately 3-4 cm in length with bilobar hyperplasia with some obstruction.  Examination of bladder revealed moderate-to-severe trabeculation with some cellules.  The ureteral orifices were unremarkable.  There was some neovascularity at the bladder neck, particularly on the left lateral to the left ureteral orifice.  On the left lateral wall, was a patch of  approximately 4 papillary lesions suspicious for possible bladder cancer.  A cup biopsy forceps was then used to biopsy these lesions and the biopsy bed was then fulgurated for hemostasis.  The confluence of the fulgurated area was approximately 2 cm.  Once hemostasis had been achieved, the bladder was partially drained and the scope was removed.  It was not felt the catheter was indicated.  The patient was taken down from lithotomy position.  His anesthetic was reversed.  He was moved to recovery room in stable condition.  There were no complications.     Marshall Cork. Jeffie Pollock, M.D.     JJW/MEDQ  D:  02/22/2015  T:  02/22/2015  Job:  [id number]

## 2015-05-03 ENCOUNTER — Other Ambulatory Visit: Payer: Self-pay | Admitting: Urology

## 2015-05-09 ENCOUNTER — Encounter (HOSPITAL_BASED_OUTPATIENT_CLINIC_OR_DEPARTMENT_OTHER): Payer: Self-pay | Admitting: *Deleted

## 2015-05-09 NOTE — H&P (Signed)
Active Problems Problems  1. Edema (R60.9) 2. Erectile dysfunction due to arterial insufficiency (N52.01) 3. History of Gross hematuria (R31.0) 4. Hypertension (I10) 5. 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 Joint pain, hip (M25.559) 8. 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 Nocturia (R35.1) 10. Nodular prostate with lower urinary tract symptoms (N40.3) 11. Prostate cancer (C61) 12. Urge incontinence of urine (N39.41)  History of Present Illness Taylor Cervantes returns today in f/u for his history of bladder and prostate cancer.  He had a TURBT on 02/23/15 that showed [place] with focal invasion. He had a T2 Gleason 7 prostate cancer treated with EXRT in 2006 and has had a PSA recurrence with a peak at 17.92 but it was back down to 16.21 prior to this visit.  He has had no hematuria but he has some frequency and urgency with incontinence. He thinks he empties and he has an adequate stream.  He has nocturia q13min initially but then ends up about 8-10x. He has large volume nocturia with edema in the legs.  The path showed a HG NMIBC that had possible focal invasion. There was no evidence of muscularis invasion. He has resumed his warfarin.   Past Medical History Problems  1. History of Carbuncle, groin (L02.234) 2. History of Gross hematuria (R31.0) 3. History of bronchitis (Z87.09) 4. History of hypercholesterolemia (Z86.39) 5. History of tinea cruris (Z86.19) 6. History of Incomplete bladder emptying (R33.9) 7. 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 History of Microscopic hematuria (R31.29) 9. History of Obstructive Sleep Apnea 10. Personal history of prostate cancer (Z85.46) 11. History of Stroke syndrome (I63.9) 12. History of Venous stasis of lower extremity (I87.8)  Surgical History Problems  1. History of Back Surgery 2. History of Cystoscopy With Fulguration Medium Lesion  (2-5cm) 3. History of Inguinal Hernia Repair 4. History of Radiation Therapy 5. History of Tonsillectomy  Current Meds 1. AmLODIPine Besylate 5 MG Oral Tablet;  Therapy: [id number] to Recorded 2. Atorvastatin Calcium 20 MG Oral Tablet;  Therapy: 15Apr2013 to Recorded 3. Tramadol-Acetaminophen 37.5-325 MG Oral Tablet;  Therapy: (Recorded:26Jan2017) to Recorded 4. Warfarin Sodium 2 MG Oral Tablet; 6mg  on Sunday, Tuesday, Thursday and 4mg  on  Monday, Wednesday, Friday and Saturday;  Therapy: [phone] to Recorded  Allergies Medication  1. Novocain SOLN 2. Penicillins 3. Sulfa Drugs  Family History Problems  1. Family history of Family Health Status Number Of Children   2 daughters 2. No pertinent family history : Sibling  Social History Problems  1. Denied: Alcohol Use (History) 2. Family history of Death In The Family Father   1978/04/30. Family history of Death In The Family Mother   65 4. Former smoker [phone]) 5. Marital History - Currently Married 6. Occupation:   Optometrist 7. Tobacco Use  Past and social history reviewed and updated.   Review of Systems  Genitourinary: no dysuria and no hematuria.  Gastrointestinal: no flank pain.  Constitutional: no fever and no recent weight loss.  Cardiovascular: leg swelling (resolving).    Vitals Vital Signs [Data Includes: Last 1 Day]  Recorded: [id number] 11:50AM  Blood Pressure: 161 / 77 Temperature: 96.8 F Heart Rate: 74  Results/Data Urine [Data Includes: Last 1 Day]   [id number]  COLOR AMBER   APPEARANCE CLEAR   SPECIFIC GRAVITY 1.025   pH 5.5   GLUCOSE NEGATIVE   BILIRUBIN NEGATIVE   KETONE TRACE   BLOOD NEGATIVE   PROTEIN 1+  NITRITE NEGATIVE   LEUKOCYTE ESTERASE NEGATIVE   SQUAMOUS EPITHELIAL/HPF 0-5 HPF  WBC 0-5 WBC/HPF  RBC 0-2 RBC/HPF  BACTERIA NONE SEEN HPF  CRYSTALS NONE SEEN HPF  CASTS NONE SEEN LPF  Yeast NONE SEEN HPF   UA and PSA reviewed.    Assessment Assessed  1. Prostate  cancer (C61) 2. 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 Nocturia (R35.1) 4. Nodular prostate with lower urinary tract symptoms (N40.3)  He is doing well with a stable PSA and no hematuria.   He continues to have significant nocturia.   Plan Health Maintenance  1. UA With REFLEX; [Do Not Release]; Status:Resulted - Requires Verification;   [id number] 11:34AM PMH: Malignant neoplasm of lateral wall of bladder  2. Cysto; Status:Hold For - Appointment,Date of Service; Requested for:13Mar2017;  3. URINE CYTOLOGY; Status:Hold For - Specimen/Data Collection,Appointment;  Requested for:13Mar2017;  4. Follow-up Month x 1 Office  Follow-up  Status: Hold For - Appointment,Date of Service   Requested for: [id number]  I discussed BCG with him again but he is not interested at this time.  I have ordered a cytology and will have him return in 1 month for cystoscopy.   He will need a PSA in 3 months.   Active Problems Problems  1. Edema (R60.9) 2. Erectile dysfunction due to arterial insufficiency (N52.01) 3. History of Gross hematuria (R31.0) 4. Hypertension (I10) 5. 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 Joint pain, hip (M25.559) 8. 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 Nocturia (R35.1) 10. Nodular prostate with lower urinary tract symptoms (N40.3) 11. Prostate cancer (C61) 12. Urge incontinence of urine (N39.41)  History of Present Illness Taylor Cervantes returns today in f/u for his history of bladder and prostate cancer.  He had a TURBT on 02/23/15 that showed [place] with focal invasion. He had a T2 Gleason 7 prostate cancer treated with EXRT in 2006 and has had a PSA recurrence with a peak at 17.92 but it was back down to 16.21 prior to this visit.  He has had no hematuria but he has some frequency and urgency with incontinence. He thinks he empties and he has an adequate stream.   He has nocturia q75min initially but then ends up about 8-10x. He has large volume nocturia with edema in the legs.  The path showed a HG NMIBC that had possible focal invasion. There was no evidence of muscularis invasion. He has resumed his warfarin.   Past Medical History Problems  1. History of Carbuncle, groin (L02.234) 2. History of Gross hematuria (R31.0) 3. History of bronchitis (Z87.09) 4. History of hypercholesterolemia (Z86.39) 5. History of tinea cruris (Z86.19) 6. History of Incomplete bladder emptying (R33.9) 7. 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 History of Microscopic hematuria (R31.29) 9. History of Obstructive Sleep Apnea 10. Personal history of prostate cancer (Z85.46) 11. History of Stroke syndrome (I63.9) 12. History of Venous stasis of lower extremity (I87.8)  Surgical History Problems  1. History of Back Surgery 2. History of Cystoscopy With Fulguration Medium Lesion (2-5cm) 3. History of Inguinal Hernia Repair 4. History of Radiation Therapy 5. History of Tonsillectomy  Current Meds 1. AmLODIPine Besylate 5 MG Oral Tablet;  Therapy: [id number] to Recorded 2. Atorvastatin Calcium 20 MG Oral Tablet;  Therapy: 15Apr2013 to Recorded 3. Tramadol-Acetaminophen 37.5-325 MG Oral Tablet;  Therapy: (Recorded:26Jan2017) to Recorded 4. Warfarin Sodium 2 MG Oral Tablet; 6mg  on  Sunday, Tuesday, Thursday and 4mg  on  Monday, Wednesday, Friday and Saturday;  Therapy: [phone] to Recorded  Allergies Medication  1. Novocain SOLN 2. Penicillins 3. Sulfa Drugs  Family History Problems  1. Family history of Family Health Status Number Of Children   2 daughters 2. No pertinent family history : Sibling  Social History Problems  1. Denied: Alcohol Use (History) 2. Family history of Death In The Family Father   April 23, 1978. Family history of Death In The Family Mother   51 4. Former smoker [phone]) 5. Marital History - Currently  Married 6. Occupation:   Optometrist 7. Tobacco Use  Past and social history reviewed and updated.   Review of Systems  Genitourinary: no dysuria and no hematuria.  Gastrointestinal: no flank pain.  Constitutional: no fever and no recent weight loss.  Cardiovascular: leg swelling (resolving).    Vitals Vital Signs [Data Includes: Last 1 Day]  Recorded: [id number] 11:50AM  Blood Pressure: 161 / 77 Temperature: 96.8 F Heart Rate: 74  Results/Data Urine [Data Includes: Last 1 Day]   [id number]  COLOR AMBER   APPEARANCE CLEAR   SPECIFIC GRAVITY 1.025   pH 5.5   GLUCOSE NEGATIVE   BILIRUBIN NEGATIVE   KETONE TRACE   BLOOD NEGATIVE   PROTEIN 1+   NITRITE NEGATIVE   LEUKOCYTE ESTERASE NEGATIVE   SQUAMOUS EPITHELIAL/HPF 0-5 HPF  WBC 0-5 WBC/HPF  RBC 0-2 RBC/HPF  BACTERIA NONE SEEN HPF  CRYSTALS NONE SEEN HPF  CASTS NONE SEEN LPF  Yeast NONE SEEN HPF   UA and PSA reviewed.    Assessment Assessed  1. Prostate cancer (C61) 2. 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 Nocturia (R35.1) 4. Nodular prostate with lower urinary tract symptoms (N40.3)  He is doing well with a stable PSA and no hematuria.   He continues to have significant nocturia.   Plan Health Maintenance  1. UA With REFLEX; [Do Not Release]; Status:Resulted - Requires Verification;   [id number] 11:34AM PMH: Malignant neoplasm of lateral wall of bladder  2. Cysto; Status:Hold For - Appointment,Date of Service; Requested for:13Mar2017;  3. URINE CYTOLOGY; Status:Hold For - Specimen/Data Collection,Appointment;  Requested for:13Mar2017;  4. Follow-up Month x 1 Office  Follow-up  Status: Hold For - Appointment,Date of Service   Requested for: [id number]  I discussed BCG with him again but he is not interested at this time.  I have ordered a cytology and will have him return in 1 month for cystoscopy.   He will need a PSA in 3 months.   History of Present Illness Mr.  Cervantes returns today in f/u for cystoscopy for his recent positive cytology with a history of a TURBT for HG T1 NMIBC in January.  He also had a T2 Gleason 7 prostate cancer treated with EXRT in 2006 and has had a PSA recurrence with a peak at 17.92 but it was back down to 16.21 earlier this month. He has remains on warfarin.   Results/Data Urine [Data Includes: Last 1 Day]   [id number]  COLOR YELLOW   APPEARANCE CLEAR   SPECIFIC GRAVITY 1.025   pH 5.5   GLUCOSE NEGATIVE   BILIRUBIN NEGATIVE   KETONE NEGATIVE   BLOOD 3+   PROTEIN TRACE   NITRITE NEGATIVE   LEUKOCYTE ESTERASE NEGATIVE   SQUAMOUS EPITHELIAL/HPF 0-5 HPF  WBC 0-5 WBC/HPF  RBC 40-60 RBC/HPF  BACTERIA FEW HPF  CRYSTALS NONE SEEN HPF  CASTS NONE SEEN LPF  Yeast  NONE SEEN HPF   The following clinical lab reports were reviewed:  UA and cytology reviewed. Selected Results  URINE CYTOLOGY [id number] 12:31PM Taylor Cervantes  SPECIMEN TYPE: URINE   Test Name Result Flag Reference  FINAL DIAGNOSIS:  A   - POSITIVE FOR MALIGNANCY. HIGH-GRADE UROTHELIAL CARCINOMA. RBC'S AND WBC'S ARE PRESENT. DUE TO THE DIAGNOSIS RENDERED ON THIS PATIENT WE REQUEST THAT THE CYTOLOGY LABORATORY BE PROVIDED WITH ADDITIONAL FOLLOW UP ON THE PATIENT WHEN AVAILABLE. THERE HAS BEEN AN INTRADEPARTMENTAL REVIEW AND THE CONSULTED PATHOLOGIST CONCURS WITH THE FINAL DIAGNOSIS.  SOURCE: Urine: Voided    20CC OF CLEAR YELLOW VURI RECEIVED IN FIXATIVE 1 SLIDE PREPARED [id number] EM  Relevant Clinical Info     PMH: MALIGNANT NEOPLASM OF LATERAL WALL OF BLADDER  PATHOLOGIST:     REVIEWED BY VALERIE J. FIELDS, MD, FCAP (ELECTRONIC SIGNATURE ON FILE)  NUMBER OF SLIDES     1 Container Submitted  CYTOTECHNOLOGIST:     KS, CT(HEW)   Procedure  Procedure: Cystoscopy   Indication: Hematuria. History of Urothelial Carcinoma. Abnormal Cytology / Tumor Marker.  Informed Consent: Risks, benefits, and potential adverse events were discussed and informed consent  was obtained from the patient.  Prep: The patient was prepped with betadine.  Antibiotic prophylaxis: Ciprofloxacin . Pt declined antibiotics.  Procedure Note:  Urethral meatus:. No abnormalities.  Anterior urethra: No abnormalities.  Prostatic urethra:. Estimated length was 4 cm. There was visual obstruction of the prostatic urethra. The lateral prostatic lobes were enlarged. There is a small left mid nodule and radiation changes.  Bladder: Visulization was clear. The right ureteral orifice was in the normal anatomic position. The left ureteral orifice was not able to be identified. Examination of the bladder demonstrated moderate trabeculation erythematous mucosa (He has an area left of the bladder neck that has what appears to be post procedural inflammation with central dystrophic calcification and necrosis. I don't see obvious cancer but the mucosa is quite abnormal. There are some changes consistent radiation cystitis at the right bladder neck. ). The patient tolerated the procedure well.  Complications: None.    Assessment Assessed  1. 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   Plan Health Maintenance  1. UA With REFLEX; [Do Not Release]; Status:Resulted - Requires Verification;   [id number] 02:45PM Hypervascular lesion of urinary bladder  2. Follow-up Schedule Surgery Office  Follow-up  Status: Hold For - Appointment   Requested for: [phone]  I am going to set him up for reresection of the tumor bed to make sure there is no residual disease. He is aware of the risks.

## 2015-05-09 NOTE — Progress Notes (Signed)
Ekg not available from PCP Dr Harrington Challenger -will need on arrival.

## 2015-05-09 NOTE — Progress Notes (Signed)
Spoke with wife-memory deficits since stroke To [facility] at  0800-Istat ,PT/inr on arrival-instructed Npo after Mn-will take norvasc with small amt water.

## 2015-05-10 ENCOUNTER — Encounter (HOSPITAL_BASED_OUTPATIENT_CLINIC_OR_DEPARTMENT_OTHER): Payer: Self-pay | Admitting: *Deleted

## 2015-05-10 ENCOUNTER — Ambulatory Visit (HOSPITAL_BASED_OUTPATIENT_CLINIC_OR_DEPARTMENT_OTHER): Payer: Medicare Other | Admitting: Certified Registered"

## 2015-05-10 ENCOUNTER — Encounter (HOSPITAL_BASED_OUTPATIENT_CLINIC_OR_DEPARTMENT_OTHER)
Admission: RE | Disposition: A | Discharge status: Home / Self Care | Payer: Self-pay | Source: Ambulatory Visit | Attending: Urology

## 2015-05-10 ENCOUNTER — Ambulatory Visit (HOSPITAL_BASED_OUTPATIENT_CLINIC_OR_DEPARTMENT_OTHER)
Admission: RE | Admit: 2015-05-10 | Discharge: 2015-05-10 | Disposition: A | Discharge status: Home / Self Care | Payer: Medicare Other | Source: Ambulatory Visit | Attending: Urology | Admitting: Urology

## 2015-05-10 DIAGNOSIS — Z8546 Personal history of malignant neoplasm of prostate: Secondary | ICD-10-CM | POA: Diagnosis not present

## 2015-05-10 DIAGNOSIS — E7800 Pure hypercholesterolemia, unspecified: Secondary | ICD-10-CM | POA: Insufficient documentation

## 2015-05-10 DIAGNOSIS — Z7901 Long term (current) use of anticoagulants: Secondary | ICD-10-CM | POA: Diagnosis not present

## 2015-05-10 DIAGNOSIS — G4733 Obstructive sleep apnea (adult) (pediatric): Secondary | ICD-10-CM | POA: Insufficient documentation

## 2015-05-10 DIAGNOSIS — Z79899 Other long term (current) drug therapy: Secondary | ICD-10-CM | POA: Insufficient documentation

## 2015-05-10 DIAGNOSIS — R3915 Urgency of urination: Secondary | ICD-10-CM | POA: Insufficient documentation

## 2015-05-10 DIAGNOSIS — Z6837 Body mass index (BMI) 37.0-37.9, adult: Secondary | ICD-10-CM | POA: Diagnosis not present

## 2015-05-10 DIAGNOSIS — R350 Frequency of micturition: Secondary | ICD-10-CM | POA: Insufficient documentation

## 2015-05-10 DIAGNOSIS — N3080 Other cystitis without hematuria: Secondary | ICD-10-CM | POA: Insufficient documentation

## 2015-05-10 DIAGNOSIS — Z923 Personal history of irradiation: Secondary | ICD-10-CM | POA: Diagnosis not present

## 2015-05-10 DIAGNOSIS — Z8551 Personal history of malignant neoplasm of bladder: Secondary | ICD-10-CM | POA: Diagnosis not present

## 2015-05-10 DIAGNOSIS — Z87891 Personal history of nicotine dependence: Secondary | ICD-10-CM | POA: Insufficient documentation

## 2015-05-10 DIAGNOSIS — M1990 Unspecified osteoarthritis, unspecified site: Secondary | ICD-10-CM | POA: Diagnosis not present

## 2015-05-10 DIAGNOSIS — Z8673 Personal history of transient ischemic attack (TIA), and cerebral infarction without residual deficits: Secondary | ICD-10-CM | POA: Diagnosis not present

## 2015-05-10 DIAGNOSIS — E669 Obesity, unspecified: Secondary | ICD-10-CM | POA: Diagnosis not present

## 2015-05-10 DIAGNOSIS — I4891 Unspecified atrial fibrillation: Secondary | ICD-10-CM | POA: Insufficient documentation

## 2015-05-10 DIAGNOSIS — N403 Nodular prostate with lower urinary tract symptoms: Secondary | ICD-10-CM | POA: Insufficient documentation

## 2015-05-10 DIAGNOSIS — N3289 Other specified disorders of bladder: Secondary | ICD-10-CM | POA: Insufficient documentation

## 2015-05-10 DIAGNOSIS — R351 Nocturia: Secondary | ICD-10-CM | POA: Diagnosis not present

## 2015-05-10 DIAGNOSIS — Z08 Encounter for follow-up examination after completed treatment for malignant neoplasm: Secondary | ICD-10-CM | POA: Diagnosis present

## 2015-05-10 HISTORY — PX: CYSTOSCOPY W/ RETROGRADES: SHX1426

## 2015-05-10 HISTORY — PX: TRANSURETHRAL RESECTION OF BLADDER TUMOR: SHX2575

## 2015-05-10 LAB — POCT I-STAT, CHEM 8
BUN: 19 mg/dL (ref 6–20)
CALCIUM ION: 1.12 mmol/L — AB (ref 1.13–1.30)
Chloride: 103 mmol/L (ref 101–111)
Creatinine, Ser: 0.8 mg/dL (ref 0.61–1.24)
GLUCOSE: 112 mg/dL — AB (ref 65–99)
HCT: 45 % (ref 39.0–52.0)
HEMOGLOBIN: 15.3 g/dL (ref 13.0–17.0)
Potassium: 3.7 mmol/L (ref 3.5–5.1)
SODIUM: 143 mmol/L (ref 135–145)
TCO2: 26 mmol/L (ref 0–100)

## 2015-05-10 LAB — PROTIME-INR
INR: 1.38 (ref 0.00–1.49)
Prothrombin Time: 16.6 seconds — ABNORMAL HIGH (ref 11.6–15.2)

## 2015-05-10 SURGERY — CYSTOSCOPY, WITH RETROGRADE PYELOGRAM
Anesthesia: General | Site: Ureter

## 2015-05-10 MED ORDER — CIPROFLOXACIN IN D5W 400 MG/200ML IV SOLN
INTRAVENOUS | Status: AC
  Filled 2015-05-10: qty 200

## 2015-05-10 MED ORDER — LIDOCAINE HCL (CARDIAC) 20 MG/ML IV SOLN
INTRAVENOUS | Status: AC
  Filled 2015-05-10: qty 5

## 2015-05-10 MED ORDER — TRAMADOL-ACETAMINOPHEN 37.5-325 MG PO TABS: 1.0000 | ORAL_TABLET | Freq: Four times a day (QID) | ORAL | Status: AC | PRN

## 2015-05-10 MED ORDER — IOPAMIDOL (ISOVUE-370) INJECTION 76%
INTRAVENOUS | Status: DC | PRN
  Administered 2015-05-10: 12 mL

## 2015-05-10 MED ORDER — DEXAMETHASONE SODIUM PHOSPHATE 10 MG/ML IJ SOLN
INTRAMUSCULAR | Status: AC
  Filled 2015-05-10: qty 1

## 2015-05-10 MED ORDER — WHITE PETROLATUM GEL
Status: AC
  Filled 2015-05-10: qty 5

## 2015-05-10 MED ORDER — ACETAMINOPHEN 650 MG RE SUPP
650.0000 mg | RECTAL | Status: DC | PRN
  Filled 2015-05-10: qty 1

## 2015-05-10 MED ORDER — FENTANYL CITRATE (PF) 100 MCG/2ML IJ SOLN
INTRAMUSCULAR | Status: AC
  Filled 2015-05-10: qty 2

## 2015-05-10 MED ORDER — FENTANYL CITRATE (PF) 100 MCG/2ML IJ SOLN
25.0000 ug | INTRAMUSCULAR | Status: DC | PRN
  Filled 2015-05-10: qty 1

## 2015-05-10 MED ORDER — STERILE WATER FOR IRRIGATION IR SOLN
Status: DC | PRN
  Administered 2015-05-10: 500 mL

## 2015-05-10 MED ORDER — SODIUM CHLORIDE 0.9 % IV SOLN
250.0000 mL | INTRAVENOUS | Status: DC | PRN
  Filled 2015-05-10: qty 250

## 2015-05-10 MED ORDER — LACTATED RINGERS IV SOLN
INTRAVENOUS | Status: DC
  Administered 2015-05-10: 09:00:00 via INTRAVENOUS
  Filled 2015-05-10: qty 1000

## 2015-05-10 MED ORDER — OXYCODONE HCL 5 MG PO TABS
5.0000 mg | ORAL_TABLET | Freq: Once | ORAL | Status: DC | PRN
  Filled 2015-05-10: qty 1

## 2015-05-10 MED ORDER — SODIUM CHLORIDE 0.9% FLUSH
3.0000 mL | Freq: Two times a day (BID) | INTRAVENOUS | Status: DC
  Filled 2015-05-10: qty 3

## 2015-05-10 MED ORDER — OXYCODONE HCL 5 MG PO TABS
5.0000 mg | ORAL_TABLET | ORAL | Status: DC | PRN
  Filled 2015-05-10: qty 2

## 2015-05-10 MED ORDER — DEXAMETHASONE SODIUM PHOSPHATE 4 MG/ML IJ SOLN
INTRAMUSCULAR | Status: DC | PRN
  Administered 2015-05-10: 10 mg via INTRAVENOUS

## 2015-05-10 MED ORDER — TRAMADOL-ACETAMINOPHEN 37.5-325 MG PO TABS
1.0000 | ORAL_TABLET | Freq: Four times a day (QID) | ORAL | Status: DC | PRN
  Administered 2015-05-10: 1 via ORAL
  Filled 2015-05-10 (×2): qty 1

## 2015-05-10 MED ORDER — OXYCODONE HCL 5 MG/5ML PO SOLN
5.0000 mg | Freq: Once | ORAL | Status: DC | PRN
  Filled 2015-05-10: qty 5

## 2015-05-10 MED ORDER — SODIUM CHLORIDE 0.9% FLUSH
3.0000 mL | INTRAVENOUS | Status: DC | PRN
  Filled 2015-05-10: qty 3

## 2015-05-10 MED ORDER — ACETAMINOPHEN 325 MG PO TABS
650.0000 mg | ORAL_TABLET | ORAL | Status: DC | PRN
  Filled 2015-05-10: qty 2

## 2015-05-10 MED ORDER — ONDANSETRON HCL 4 MG/2ML IJ SOLN
INTRAMUSCULAR | Status: DC | PRN
  Administered 2015-05-10: 4 mg via INTRAVENOUS

## 2015-05-10 MED ORDER — CIPROFLOXACIN IN D5W 400 MG/200ML IV SOLN
400.0000 mg | INTRAVENOUS | Status: AC
  Administered 2015-05-10: 400 mg via INTRAVENOUS
  Filled 2015-05-10: qty 200

## 2015-05-10 MED ORDER — ONDANSETRON HCL 4 MG/2ML IJ SOLN
INTRAMUSCULAR | Status: AC
  Filled 2015-05-10: qty 2

## 2015-05-10 MED ORDER — ONDANSETRON HCL 4 MG/2ML IJ SOLN
4.0000 mg | Freq: Four times a day (QID) | INTRAMUSCULAR | Status: DC | PRN
  Filled 2015-05-10: qty 2

## 2015-05-10 MED ORDER — PROPOFOL 10 MG/ML IV BOLUS
INTRAVENOUS | Status: AC
  Filled 2015-05-10: qty 20

## 2015-05-10 MED ORDER — PROPOFOL 10 MG/ML IV BOLUS
INTRAVENOUS | Status: DC | PRN
  Administered 2015-05-10: 180 mg via INTRAVENOUS

## 2015-05-10 MED ORDER — LIDOCAINE HCL (CARDIAC) 20 MG/ML IV SOLN
INTRAVENOUS | Status: DC | PRN
  Administered 2015-05-10: 60 mg via INTRAVENOUS

## 2015-05-10 MED ORDER — FENTANYL CITRATE (PF) 100 MCG/2ML IJ SOLN
INTRAMUSCULAR | Status: DC | PRN
  Administered 2015-05-10: 50 ug via INTRAVENOUS
  Administered 2015-05-10 (×2): 25 ug via INTRAVENOUS

## 2015-05-10 MED ORDER — SODIUM CHLORIDE 0.9 % IR SOLN
Status: DC | PRN
  Administered 2015-05-10 (×2): 1000 mL
  Administered 2015-05-10: 3000 mL
  Administered 2015-05-10 (×2): 1000 mL

## 2015-05-10 SURGICAL SUPPLY — 37 items
BAG DRAIN URO-CYSTO SKYTR STRL (DRAIN) ×3 IMPLANT
BAG DRN ANRFLXCHMBR STRAP LEK (BAG)
BAG DRN UROCATH (DRAIN) ×2
BAG URINE DRAINAGE (UROLOGICAL SUPPLIES) ×1 IMPLANT
BAG URINE LEG 19OZ MD ST LTX (BAG) IMPLANT
BASKET SEGURA 3FR (UROLOGICAL SUPPLIES) IMPLANT
CATH FOLEY 2WAY SLVR  5CC 20FR (CATHETERS) ×1
CATH FOLEY 2WAY SLVR  5CC 22FR (CATHETERS)
CATH FOLEY 2WAY SLVR 5CC 20FR (CATHETERS) IMPLANT
CATH FOLEY 2WAY SLVR 5CC 22FR (CATHETERS) IMPLANT
CATH URET 5FR 28IN CONE TIP (BALLOONS)
CATH URET 5FR 28IN OPEN ENDED (CATHETERS) IMPLANT
CATH URET 5FR 70CM CONE TIP (BALLOONS) IMPLANT
CLOTH BEACON ORANGE TIMEOUT ST (SAFETY) ×3 IMPLANT
FIBER LASER TRAC TIP (UROLOGICAL SUPPLIES) IMPLANT
GLOVE SURG SS PI 8.0 STRL IVOR (GLOVE) ×3 IMPLANT
GOWN STRL REUS W/ TWL XL LVL3 (GOWN DISPOSABLE) ×2 IMPLANT
GOWN STRL REUS W/TWL XL LVL3 (GOWN DISPOSABLE) ×3
GOWN XL W/COTTON TOWEL STD (GOWNS) ×3 IMPLANT
GUIDEWIRE 0.038 PTFE COATED (WIRE) IMPLANT
GUIDEWIRE ANG ZIPWIRE 038X150 (WIRE) IMPLANT
GUIDEWIRE STR DUAL SENSOR (WIRE) ×3 IMPLANT
HOLDER FOLEY CATH W/STRAP (MISCELLANEOUS) ×1 IMPLANT
IV NS 1000ML (IV SOLUTION) ×12
IV NS 1000ML BAXH (IV SOLUTION) IMPLANT
KIT BALLIN UROMAX 15FX10 (LABEL) IMPLANT
KIT BALLN UROMAX 15FX4 (MISCELLANEOUS) IMPLANT
KIT BALLN UROMAX 26 75X4 (MISCELLANEOUS)
KIT ROOM TURNOVER WOR (KITS) ×3 IMPLANT
LOOP CUT BIPOLAR 24F LRG (ELECTROSURGICAL) ×1 IMPLANT
MANIFOLD NEPTUNE II (INSTRUMENTS) ×1 IMPLANT
PACK CYSTO (CUSTOM PROCEDURE TRAY) ×3 IMPLANT
PLUG CATH AND CAP STER (CATHETERS) IMPLANT
SET ASPIRATION TUBING (TUBING) IMPLANT
SET HIGH PRES BAL DIL (LABEL)
SYRINGE IRR TOOMEY STRL 70CC (SYRINGE) IMPLANT
TUBE CONNECTING 12X1/4 (SUCTIONS) IMPLANT

## 2015-05-10 NOTE — Anesthesia Procedure Notes (Signed)
Procedure Name: LMA Insertion Date/Time: 05/10/2015 9:29 AM Performed by: Denna Haggard D Pre-anesthesia Checklist: Patient identified, Emergency Drugs available, Suction available and Patient being monitored Patient Re-evaluated:Patient Re-evaluated prior to inductionOxygen Delivery Method: Circle System Utilized Preoxygenation: Pre-oxygenation with 100% oxygen Intubation Type: IV induction Ventilation: Mask ventilation without difficulty LMA: LMA inserted LMA Size: 5.0 Number of attempts: 1 Airway Equipment and Method: Bite block Placement Confirmation: positive ETCO2 Tube secured with: Tape Dental Injury: Teeth and Oropharynx as per pre-operative assessment

## 2015-05-10 NOTE — Interval H&P Note (Signed)
History and Physical Interval Note:  05/10/2015 9:13 AM  Taylor Cervantes  has presented today for surgery, with the diagnosis of BLADDER CANCER   The various methods of treatment have been discussed with the patient and family. After consideration of risks, benefits and other options for treatment, the patient has consented to  Procedure(s): CYSTOSCOPY WITH BILATERAL RETROGRADE  (Bilateral) TRANSURETHRAL RESECTION OF BLADDER TUMOR (TURBT) (N/A) as a surgical intervention .  The patient's history has been reviewed, patient examined, no change in status, stable for surgery.  I have reviewed the patient's chart and labs.  Questions were answered to the patient's satisfaction.     Mccall Lomax J

## 2015-05-10 NOTE — Discharge Instructions (Addendum)
CYSTOSCOPY HOME CARE INSTRUCTIONS  Activity: Rest for the remainder of the day.  Do not drive or operate equipment today.  You may resume normal activities in one to two days as instructed by your physician.   Meals: Drink plenty of liquids and eat light foods such as gelatin or soup this evening.  You may return to a normal meal plan tomorrow.  Return to Work: You may return to work in one to two days or as instructed by your physician.  Special Instructions / Symptoms: Call your physician if any of these symptoms occur:   -persistent or heavy bleeding  -bleeding which continues after first few urination  -large blood clots that are difficult to pass  -urine stream diminishes or stops completely  -fever equal to or higher than 101 degrees Farenheit.  -cloudy urine with a strong, foul odor  -severe pain  Females should always wipe from front to back after elimination.  You may feel some burning pain when you urinate.  This should disappear with time.  Applying moist heat to the lower abdomen or a hot tub bath may help relieve the pain. \  You may removed the catheter in the morning and if you don't feel comfortable doing that you can come to the office.  You may resume the warfarin in 48 hours if there is no bleeding.   Patient Signature:  ________________________________________________________  Nurse's Signature:  ________________________________________________________  Foley Catheter Care, Adult A Foley catheter is a soft, flexible tube. This tube is placed into your bladder to drain pee (urine). If you go home with this catheter in place, follow the instructions below. TAKING CARE OF THE CATHETER 1. Wash your hands with soap and water. 2. Put soap and water on a clean washcloth.  Clean the skin where the tube goes into your body.  Clean away from the tube site.  Never wipe toward the tube.  Clean the area using a circular motion.  Remove all the soap. Pat the area  dry with a clean towel. For males, reposition the skin that covers the end of the penis (foreskin). 3. Attach the tube to your leg with tape or a leg strap. Do not stretch the tube tight. If you are using tape, remove any stickiness left behind by past tape you used. 4. Keep the drainage bag below your hips. Keep it off the floor. 5. Check your tube during the day. Make sure it is working and draining. Make sure the tube does not curl, twist, or bend. 6. Do not pull on the tube or try to take it out. TAKING CARE OF THE DRAINAGE BAGS You will have a large overnight drainage bag and a small leg bag. You may wear the overnight bag any time. Never wear the small bag at night. Follow the directions below. Emptying the Drainage Bag Empty your drainage bag when it is  - full or at least 2-3 times a day. 1. Wash your hands with soap and water. 2. Keep the drainage bag below your hips. 3. Hold the dirty bag over the toilet or clean container. 4. Open the pour spout at the bottom of the bag. Empty the pee into the toilet or container. Do not let the pour spout touch anything. 5. Clean the pour spout with a gauze pad or cotton ball that has rubbing alcohol on it. 6. Close the pour spout. 7. Attach the bag to your leg with tape or a leg strap. 8. Wash your hands well.  Changing the Drainage Bag Change your bag once a month or sooner if it starts to smell or look dirty.  1. Wash your hands with soap and water. 2. Pinch the rubber tube so that pee does not spill out. 3. Disconnect the catheter tube from the drainage tube at the connection valve. Do not let the tubes touch anything. 4. Clean the end of the catheter tube with an alcohol wipe. Clean the end of a the drainage tube with a different alcohol wipe. 5. Connect the catheter tube to the drainage tube of the clean drainage bag. 6. Attach the new bag to the leg with tape or a leg strap. Avoid attaching the new bag too tightly. 7. Wash your hands  well. Cleaning the Drainage Bag 1. Wash your hands with soap and water. 2. Wash the bag in warm, soapy water. 3. Rinse the bag with warm water. 4. Fill the bag with a mixture of white vinegar and water (1 cup vinegar to 1 quart warm water [.2 liter vinegar to 1 liter warm water]). Close the bag and soak it for 30 minutes in the solution. 5. Rinse the bag with warm water. 6. Hang the bag to dry with the pour spout open and hanging downward. 7. Store the clean bag (once it is dry) in a clean plastic bag. 8. Wash your hands well. PREVENT INFECTION  Wash your hands before and after touching your tube.  Take showers every day. Wash the skin where the tube enters your body. Do not take baths. Replace wet leg straps with dry ones, if this applies.  Do not use powders, sprays, or lotions on the genital area. Only use creams, lotions, or ointments as told by your doctor.  For females, wipe from front to back after going to the bathroom.  Drink enough fluids to keep your pee clear or pale yellow unless you are told not to have too much fluid (fluid restriction).  Do not let the drainage bag or tubing touch or lie on the floor.  Wear cotton underwear to keep the area dry. GET HELP IF:  Your pee is cloudy or smells unusually bad.  Your tube becomes clogged.  You are not draining pee into the bag or your bladder feels full.  Your tube starts to leak. GET HELP RIGHT AWAY IF:  You have pain, puffiness (swelling), redness, or yellowish-white fluid (pus) where the tube enters the body.  You have pain in the belly (abdomen), legs, lower back, or bladder.  You have a fever.  You see blood fill the tube, or your pee is pink or red.  You feel sick to your stomach (nauseous), throw up (vomit), or have chills.  Your tube gets pulled out. MAKE SURE YOU:   Understand these instructions.  Will watch your condition.  Will get help right away if you are not doing well or get worse.   This  information is not intended to replace advice given to you by your health care provider. Make sure you discuss any questions you have with your health care provider.   Document Released: 05/17/2012 Document Revised: 02/10/2014 Document Reviewed: 05/17/2012 Elsevier Interactive Patient Education 2016 [place] Instructions  Activity: Get plenty of rest for the remainder of the day. A responsible adult should stay with you for 24 hours following the procedure.  For the next 24 hours, DO NOT: -Drive a car -Paediatric nurse -Drink alcoholic beverages -Take any medication unless instructed  by your physician -Make any legal decisions or sign important papers.  Meals: Start with liquid foods such as gelatin or soup. Progress to regular foods as tolerated. Avoid greasy, spicy, heavy foods. If nausea and/or vomiting occur, drink only clear liquids until the nausea and/or vomiting subsides. Call your physician if vomiting continues.  Special Instructions/Symptoms: Your throat may feel dry or sore from the anesthesia or the breathing tube placed in your throat during surgery. If this causes discomfort, gargle with warm salt water. The discomfort should disappear within 24 hours.  If you had a scopolamine patch placed behind your ear for the management of post- operative nausea and/or vomiting:  1. The medication in the patch is effective for 72 hours, after which it should be removed.  Wrap patch in a tissue and discard in the trash. Wash hands thoroughly with soap and water. 2. You may remove the patch earlier than 72 hours if you experience unpleasant side effects which may include dry mouth, dizziness or visual disturbances. 3. Avoid touching the patch. Wash your hands with soap and water after contact with the patch.

## 2015-05-10 NOTE — Transfer of Care (Signed)
Immediate Anesthesia Transfer of Care Note  Patient: Taylor Cervantes  Procedure(s) Performed: Procedure(s) (LRB): CYSTOSCOPY WITH BILATERAL RETROGRADE  (Bilateral) TRANSURETHRAL RESECTION OF BLADDER TUMOR (TURBT) (N/A)  Patient Location: PACU  Anesthesia Type: General  Level of Consciousness: awake, oriented, sedated and patient cooperative  Airway & Oxygen Therapy: Patient Spontanous Breathing and Patient connected to face mask oxygen  Post-op Assessment: Report given to PACU RN and Post -op Vital signs reviewed and stable  Post vital signs: Reviewed and stable  Complications: No apparent anesthesia complications Last Vitals:  Filed Vitals:   05/10/15 0820 05/10/15 1017  BP: 143/61 154/72  Pulse: 59 64  Temp: 36.6 C 36.5 C  Resp: 16 11

## 2015-05-10 NOTE — Op Note (Signed)
NAMEDAMONIE, YELINEK NO.:  [id number]  MEDICAL RECORD NO.:  [id number]  LOCATION:                                 FACILITY:  PHYSICIAN:  Marshall Cork. Jeffie Pollock, M.D.    DATE OF BIRTH:  10-11-1934  DATE OF PROCEDURE:  05/10/2015 DATE OF DISCHARGE:                              OPERATIVE REPORT   PROCEDURE: 1. Cystoscopy with bilateral retrograde pyelograms and interpretation. 2. Transurethral resection of medium bladder tumor.  PREOPERATIVE DIAGNOSIS:  History of bladder tumor with positive cytology.  POSTOPERATIVE DIAGNOSIS:  History of bladder tumor with positive cytology.  SURGEON:  Marshall Cork. Jeffie Pollock, MD  ANESTHESIA:  General.  SPECIMEN:  Bladder tumor chips.  DRAINS:  A 20-French Foley catheter.  BLOOD LOSS:  Minimal.  COMPLICATIONS:  None.  INDICATIONS:  Mr. Riedman is a 81 year old, white male, who underwent resection of a tumor from the left bladder neck area approximately 4 months ago.  He was found to have a high-grade superficially invasive tumor, but declined BCG therapy.  Subsequent followup demonstrated a positive cytology and a healing lesion on the left bladder neck with some findings suspicious for possible residual disease.  It was felt that cysto retrograde pyelography and resection was indicated.  FINDINGS OF PROCEDURE:  He was taken to the operating room where general anesthetic was induced.  He was given Cipro.  He was placed in lithotomy position and was fitted with PAS hose.  His perineum and genitalia were prepped with Betadine solution.  He was draped in the usual sterile fashion.  Cystoscopy was performed using a 23-French scope and 30 and 70 degree lenses.  Examination revealed a normal urethra.  The external sphincter was intact.  The prostatic urethra was approximately 5 cm in length with bilobar hyperplasia with obstruction.  He had no middle lobe. Examination of bladder revealed the right ureteral orifice in the  normal anatomic position.  The left ureteral orifice was not seen as there was edema and possible recurrent tumor in this area extending approximately 4-5 cm in diameter with some central necrosis.  Inspection of the remainder of the bladder wall revealed moderate trabeculation and no other tumors or areas of erythema.  No stones were noted.  After initial cystoscopy, a right retrograde pyelogram was performed with 5-French open-end catheter.  Isovue was instilled, this demonstrated some J-hooking of the distal ureter, but an otherwise normal ureter and intrarenal collecting system without filling defects.  An attempt was made to perform a left retrograde pyelogram, but I was unable identify the orifice with either the 30 or 70 degree lenses.  At this point, the cystoscope was exchanged for 28-French continuous flow resectoscope sheath.  This was fitted with Beatrix Fetters handle with the bipolar loop and the 30-degree lens and saline was used as an irrigant. The abnormal area at the right bladder neck was widely resected to remove all abnormal tissue.  The diameter of the resection was approximately 5 cm.  During the resection, the left ureteral orifice was exposed.  Great care was taken to avoid fulguration around the orifice.  The bladder was evacuated free of specimens and hemostasis was achieved. The resectoscope was  removed and the cystoscope was reinserted.  I was able to successfully cannulate the left ureteral orifice and a retrograde pyelogram was performed which once again revealed J-hooking of the distal ureter, but an otherwise normal internal ureter and intrarenal collecting system without filling defects.  After completion of the retrograde, final inspection revealed no active bleeding and no retained chips.  The scope was removed and a 20-French Foley catheter was inserted.  The balloon was filled with 10 mL of sterile fluid.  The catheter returned clear irrigant and was  placed to straight drainage.  The patient was taken down from lithotomy position.  His anesthetic was reversed and he was moved to recovery room in stable condition.  There were no complications.     Marshall Cork. Jeffie Pollock, M.D.     JJW/MEDQ  D:  05/10/2015  T:  05/10/2015  Job:  [id number]

## 2015-05-10 NOTE — Brief Op Note (Signed)
05/10/2015  10:05 AM  PATIENT:  Denton Ar  81 y.o. male  PRE-OPERATIVE DIAGNOSIS:  BLADDER CANCER   POST-OPERATIVE DIAGNOSIS:  BLADDER CANCER   PROCEDURE:  Procedure(s): CYSTOSCOPY WITH BILATERAL RETROGRADE  (Bilateral) TRANSURETHRAL RESECTION OF BLADDER TUMOR (TURBT) (N/A) medium  SURGEON:  Surgeon(s) and Role:    * Irine Seal, MD - Primary  PHYSICIAN ASSISTANT:   ASSISTANTS: none   ANESTHESIA:   general  EBL:  Total I/O In: 100 [I.V.:100] Out: 0   BLOOD ADMINISTERED:none  DRAINS: Urinary Catheter (Foley)   LOCAL MEDICATIONS USED:  NONE  SPECIMEN:  Source of Specimen:  bladder tumor  DISPOSITION OF SPECIMEN:  PATHOLOGY  COUNTS:  YES  TOURNIQUET:  * No tourniquets in log *  DICTATION: .Other Dictation: Dictation Number [id number]  PLAN OF CARE: Discharge to home after PACU  PATIENT DISPOSITION:  PACU - hemodynamically stable.   Delay start of Pharmacological VTE agent (>24hrs) due to surgical blood loss or risk of bleeding: yes

## 2015-05-10 NOTE — Anesthesia Preprocedure Evaluation (Signed)
Anesthesia Evaluation  Patient identified by MRN, date of birth, ID band Patient awake    Reviewed: Allergy & Precautions, NPO status , Patient's Chart, lab work & pertinent test results  Airway Mallampati: II   Neck ROM: full    Dental   Pulmonary sleep apnea , former smoker,    breath sounds clear to auscultation       Cardiovascular + dysrhythmias Atrial Fibrillation  Rhythm:regular Rate:Normal     Neuro/Psych CVA    GI/Hepatic   Endo/Other  obese  Renal/GU      Musculoskeletal  (+) Arthritis ,   Abdominal   Peds  Hematology   Anesthesia Other Findings   Reproductive/Obstetrics                             Anesthesia Physical Anesthesia Plan  ASA: III  Anesthesia Plan: General   Post-op Pain Management:    Induction: Intravenous  Airway Management Planned: LMA  Additional Equipment:   Intra-op Plan:   Post-operative Plan:   Informed Consent: I have reviewed the patients History and Physical, chart, labs and discussed the procedure including the risks, benefits and alternatives for the proposed anesthesia with the patient or authorized representative who has indicated his/her understanding and acceptance.     Plan Discussed with: CRNA, Anesthesiologist and Surgeon  Anesthesia Plan Comments:         Anesthesia Quick Evaluation

## 2015-05-10 NOTE — Anesthesia Postprocedure Evaluation (Signed)
Anesthesia Post Note  Patient: Taylor Cervantes  Procedure(s) Performed: Procedure(s) (LRB): CYSTOSCOPY WITH BILATERAL RETROGRADE  (Bilateral) TRANSURETHRAL RESECTION OF BLADDER TUMOR (TURBT) (N/A)  Patient location during evaluation: PACU Anesthesia Type: General Level of consciousness: awake and alert and patient cooperative Pain management: pain level controlled Vital Signs Assessment: post-procedure vital signs reviewed and stable Respiratory status: spontaneous breathing and respiratory function stable Cardiovascular status: stable Anesthetic complications: no    Last Vitals:  Filed Vitals:   05/10/15 1045 05/10/15 1055  BP: 161/89 160/87  Pulse: 57 67  Temp:    Resp: 12 21    Last Pain:  Filed Vitals:   05/10/15 1100  PainSc: [place]

## 2015-05-11 ENCOUNTER — Encounter (HOSPITAL_BASED_OUTPATIENT_CLINIC_OR_DEPARTMENT_OTHER): Payer: Self-pay | Admitting: Urology

## 2015-05-29 ENCOUNTER — Other Ambulatory Visit: Payer: Self-pay | Admitting: Urology

## 2015-05-29 DIAGNOSIS — N1330 Unspecified hydronephrosis: Secondary | ICD-10-CM

## 2015-06-12 ENCOUNTER — Encounter (HOSPITAL_COMMUNITY)
Admission: RE | Admit: 2015-06-12 | Discharge: 2015-06-12 | Disposition: A | Discharge status: Home / Self Care | Payer: Medicare Other | Source: Ambulatory Visit | Attending: Urology | Admitting: Urology

## 2015-06-12 DIAGNOSIS — N1330 Unspecified hydronephrosis: Secondary | ICD-10-CM

## 2015-06-12 MED ORDER — FUROSEMIDE 10 MG/ML IJ SOLN
60.0000 mg | Freq: Once | INTRAMUSCULAR | Status: AC
  Administered 2015-06-12: 60 mg via INTRAVENOUS

## 2015-06-12 MED ORDER — FUROSEMIDE 10 MG/ML IJ SOLN
INTRAMUSCULAR | Status: AC
  Filled 2015-06-12: qty 8

## 2015-06-12 MED ORDER — TECHNETIUM TC 99M MERTIATIDE
14.9000 | Freq: Once | INTRAVENOUS | Status: AC | PRN
  Administered 2015-06-12: 14.9 via INTRAVENOUS

## 2016-09-08 ENCOUNTER — Other Ambulatory Visit: Payer: Self-pay | Admitting: Urology

## 2016-09-08 DIAGNOSIS — C61 Malignant neoplasm of prostate: Secondary | ICD-10-CM

## 2016-09-18 ENCOUNTER — Encounter (HOSPITAL_COMMUNITY)
Admission: RE | Admit: 2016-09-18 | Discharge: 2016-09-18 | Disposition: A | Discharge status: Home / Self Care | Payer: Medicare Other | Source: Ambulatory Visit | Attending: Urology | Admitting: Urology

## 2016-09-18 DIAGNOSIS — C61 Malignant neoplasm of prostate: Secondary | ICD-10-CM | POA: Diagnosis not present

## 2016-09-18 MED ORDER — AXUMIN (FLUCICLOVINE F 18) INJECTION
8.7000 | Freq: Once | INTRAVENOUS | Status: AC
  Administered 2016-09-18: 8.7 via INTRAVENOUS

## 2017-12-07 IMAGING — CT NM PET NOPR SKULL BASE TO THIGH
8 series · 25 of 25 positions shown · non-contrast
Comparison: F 18 PET scan 01/06/2017, bone scan 05/29/2014

CLINICAL DATA: Prostate cancer with biochemical recurrence. PSA
equal 28.5 (08/15/2016). Patient status post radiation therapy

EXAM:
NUCLEAR MEDICINE PET SKULL BASE TO THIGH
TECHNIQUE: 8.6 mCi F-18 Fluciclovine was injected intravenously. Full-ring PET
imaging was performed from the skull base to thigh after the
radiotracer. CT data was obtained and used for attenuation
correction and anatomic localization.

[Series 3: pet sk_thigh ac · axial · 5.0mm · 4.07mm/px · z∈[+340,+1308]mm · 5 of 243 slices shown]
[im 1/243]
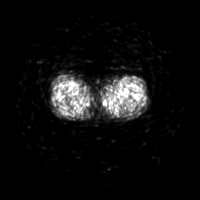
[im 61/243]
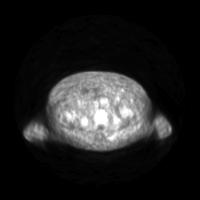
[im 122/243]
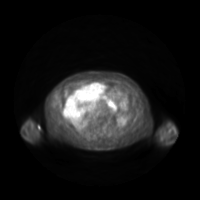
[im 182/243]
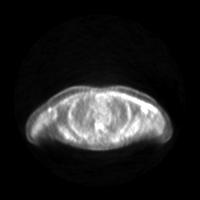
[im 243/243]
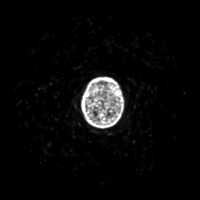

[Series 4: ct sk_thigh 5.0 hd_fov · axial · 5.0mm · 1.17mm/px · z∈[+340,+1308]mm · 5 of 243 slices shown]
[im 1/243]
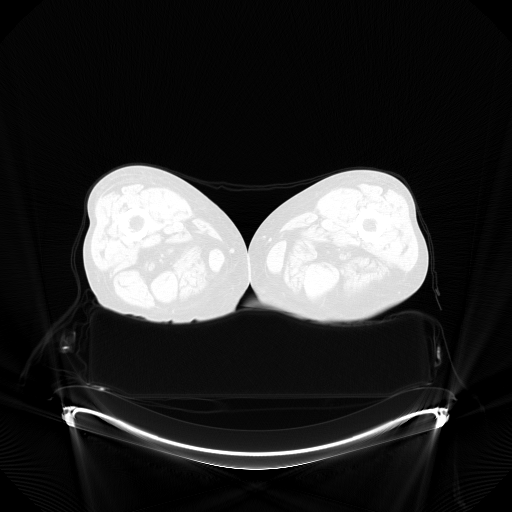
[im 61/243]
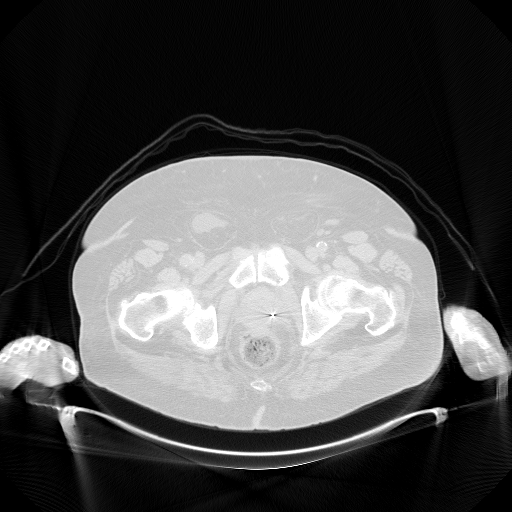
[im 122/243]
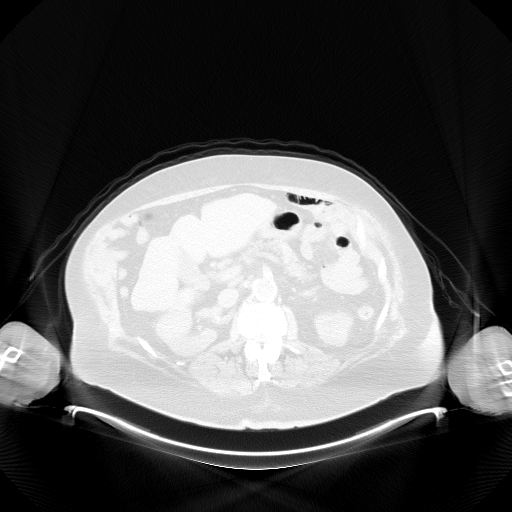
[im 182/243]
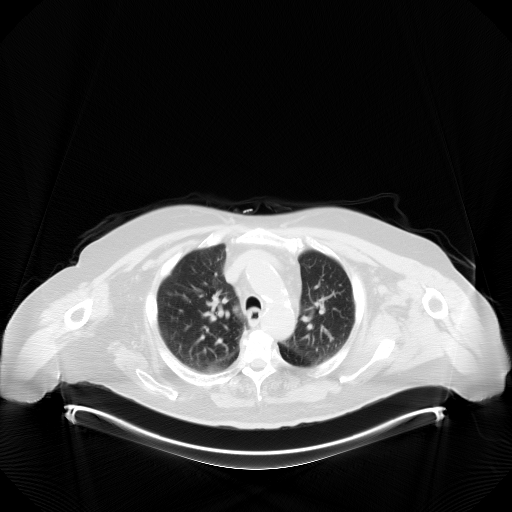
[im 243/243]
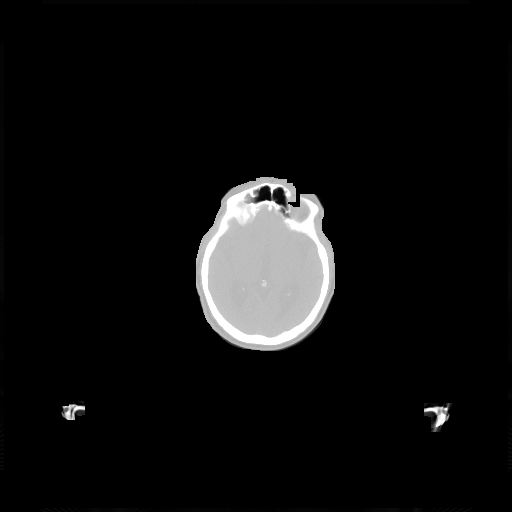

[Series 7: pet sk_thigh nac · axial · 5.0mm · 4.07mm/px · z∈[+340,+1308]mm · 5 of 243 slices shown]
[im 1/243]
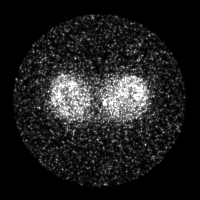
[im 61/243]
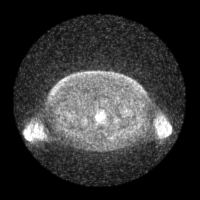
[im 122/243]
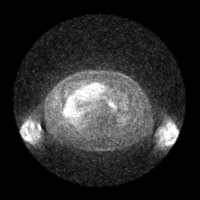
[im 182/243]
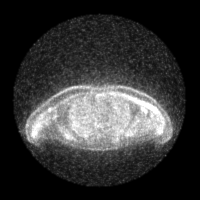
[im 243/243]
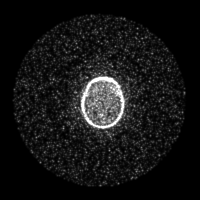

[Series 8: ct sk_thigh 5.0 b70f (id)_bone · axial · 5.0mm · 0.67mm/px · 1 of 68 slices shown]
[im 1/68  bone]
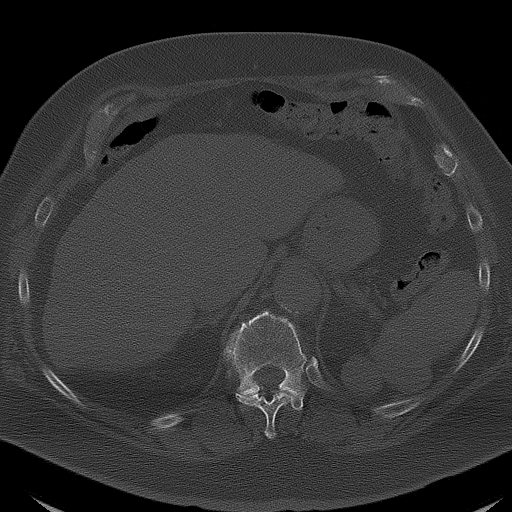

[Series 604: range-ct sk_thigh 5.0 hd_fov-cor-<alpha range> · 2 of 71 slices shown]
[im 1/71]
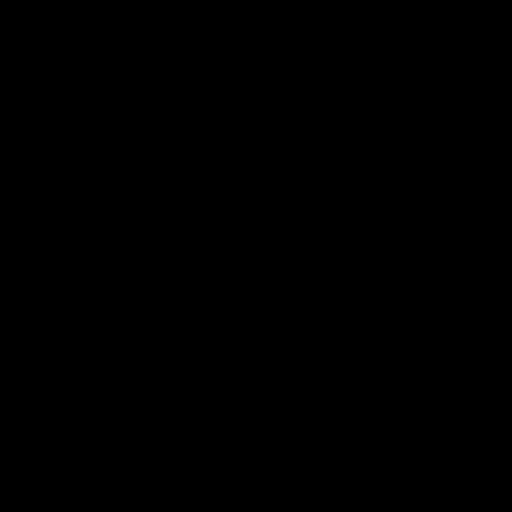
[im 71/71]
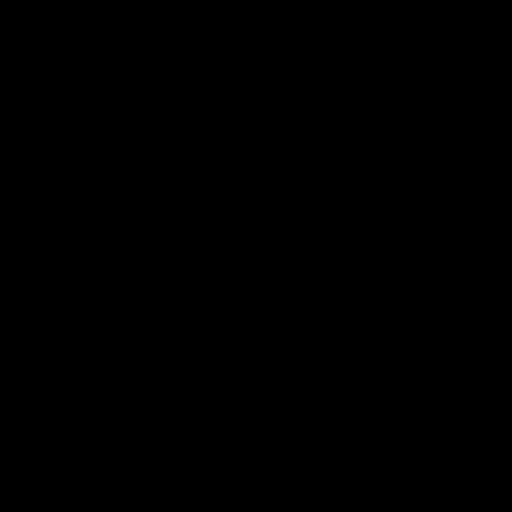

[Series 605: mip collection · coronal · 2.01mm/px · 1 of 32 slices shown]
[im 1/32]
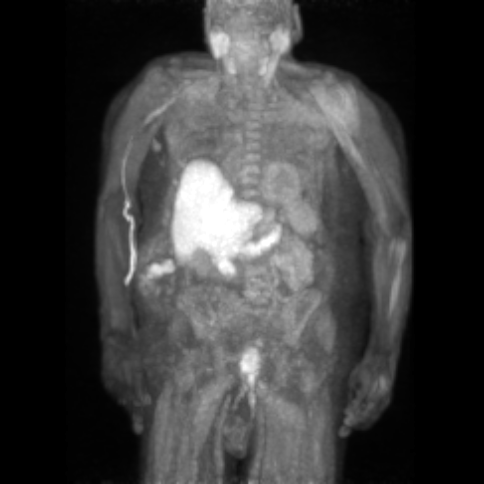

[Series 606: range-ct sk_thigh 5.0 hd_fov-tra-<alpha range> · 5 of 237 slices shown]
[im 1/237]
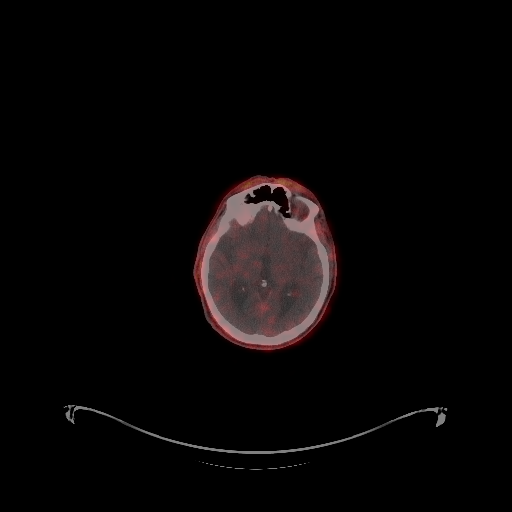
[im 60/237]
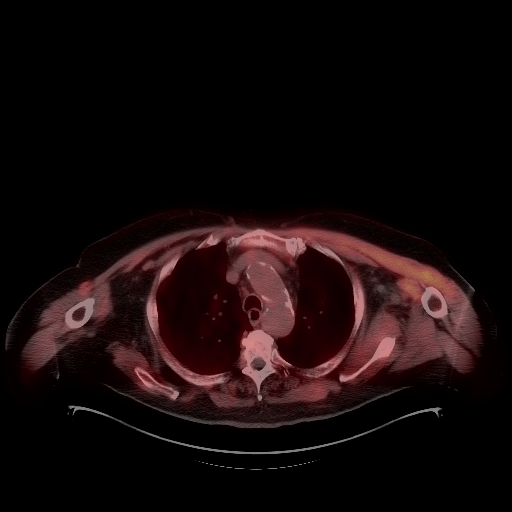
[im 119/237]
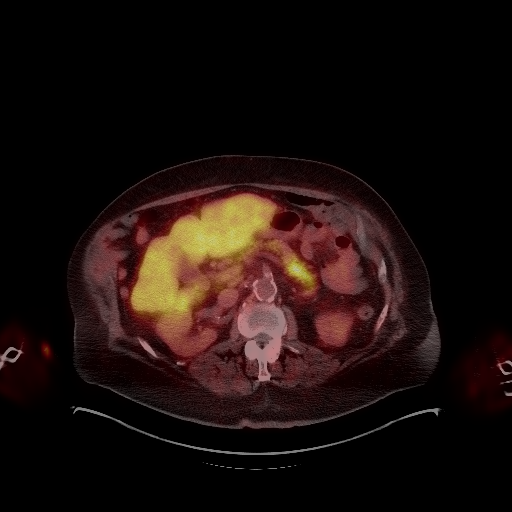
[im 178/237]
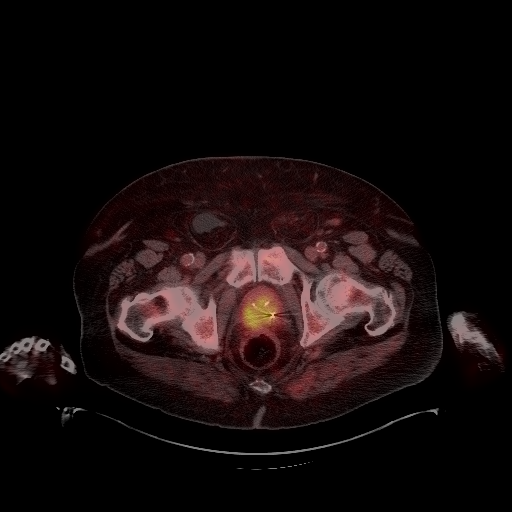
[im 237/237]
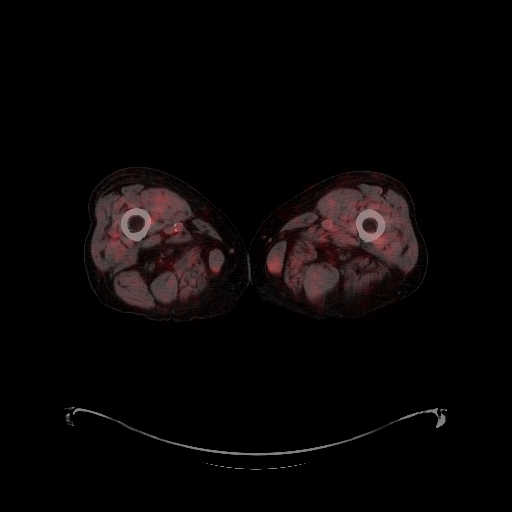

[Series 1145: results mm oncology reading · 1.29mm/px · 1 of 1 slices shown]
[im 1/1]
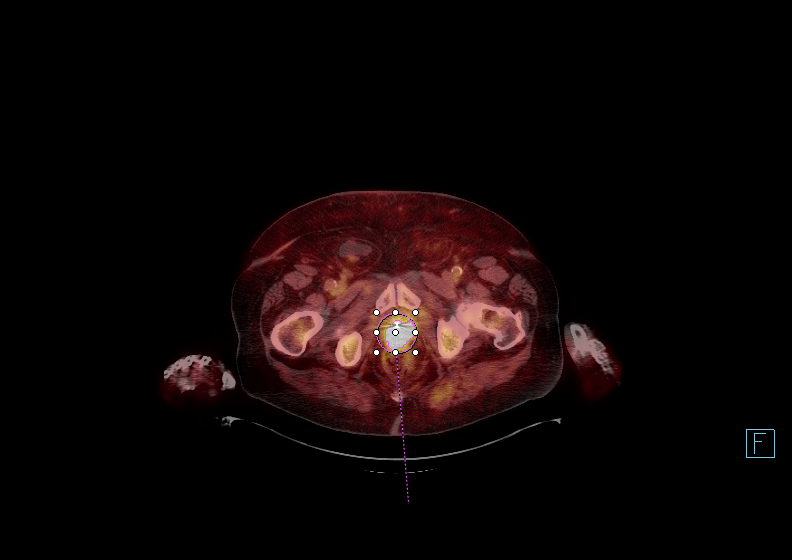

[25 of 25 positions shown; findings below may reference images not displayed]

FINDINGS: NECK

No radiotracer activity in neck lymph nodes.

CHEST

No radiotracer accumulation within mediastinal or hilar lymph nodes.
No suspicious pulmonary nodules on the CT scan.

ABDOMEN/PELVIS

There is intense radiotracer activity within the prostate gland
involving the entire gland. Radiotracer uptake is intense with SUV
max equal 9.9.

No radiotracer accumulation within pelvic lymph nodes. No
retroperitoneal or periaortic lymph node radiotracer accumulation.

Physiologic activity noted liver and pancreas.

Mild hydronephrosis and hydroureter of the LEFT kidney. No
obstructing lesion identified.

SKELETON

Heterogeneous uptake within the skeleton without focality is favored
background uptake
IMPRESSION: 1. Intense radiotracer activity within the prostate gland is greater
than typically seen with benign prostate hypertrophy. Findings most
suggestive of residual carcinoma in the prostate gland.
2. No evidence of local nodal metastasis or distant nodal
metastasis.
3. No evidence soft tissue metastasis .
4. No skeletal metastasis.
5. Mild hydronephrosis and hydroureter of the LEFT renal collecting
system.

## 2018-09-04 DEATH — deceased
